# Patient Record
Sex: Male | Born: 1975 | Race: Black or African American | Hispanic: No | Marital: Married | Smoking: Never smoker
Health system: Southern US, Community
[De-identification: ages and names within clinical notes are randomized; demographics above are authoritative.]

## PROBLEM LIST (undated history)

## (undated) DIAGNOSIS — F329 Major depressive disorder, single episode, unspecified: Secondary | ICD-10-CM

## (undated) DIAGNOSIS — I509 Heart failure, unspecified: Secondary | ICD-10-CM

## (undated) DIAGNOSIS — T1491XA Suicide attempt, initial encounter: Secondary | ICD-10-CM

## (undated) DIAGNOSIS — F32A Depression, unspecified: Secondary | ICD-10-CM

## (undated) DIAGNOSIS — I10 Essential (primary) hypertension: Secondary | ICD-10-CM

## (undated) HISTORY — PX: NO PAST SURGERIES: SHX2092

## (undated) HISTORY — DX: Essential (primary) hypertension: I10

---

## 2018-04-16 ENCOUNTER — Emergency Department (HOSPITAL_COMMUNITY): Payer: Self-pay

## 2018-04-16 ENCOUNTER — Other Ambulatory Visit: Payer: Self-pay

## 2018-04-16 ENCOUNTER — Encounter (HOSPITAL_COMMUNITY): Payer: Self-pay | Admitting: Emergency Medicine

## 2018-04-16 ENCOUNTER — Inpatient Hospital Stay (HOSPITAL_COMMUNITY)
Admission: EM | Admit: 2018-04-16 | Discharge: 2018-04-21 | DRG: 918 | Disposition: A | Discharge status: Other Acute Inpatient Hospital | Payer: Self-pay | Attending: Internal Medicine | Admitting: Internal Medicine

## 2018-04-16 DIAGNOSIS — T50901A Poisoning by unspecified drugs, medicaments and biological substances, accidental (unintentional), initial encounter: Secondary | ICD-10-CM | POA: Diagnosis present

## 2018-04-16 DIAGNOSIS — R45850 Homicidal ideations: Secondary | ICD-10-CM | POA: Diagnosis present

## 2018-04-16 DIAGNOSIS — Z653 Problems related to other legal circumstances: Secondary | ICD-10-CM

## 2018-04-16 DIAGNOSIS — T1491XA Suicide attempt, initial encounter: Secondary | ICD-10-CM | POA: Diagnosis present

## 2018-04-16 DIAGNOSIS — Z6835 Body mass index (BMI) 35.0-35.9, adult: Secondary | ICD-10-CM

## 2018-04-16 DIAGNOSIS — Z79899 Other long term (current) drug therapy: Secondary | ICD-10-CM

## 2018-04-16 DIAGNOSIS — F419 Anxiety disorder, unspecified: Secondary | ICD-10-CM | POA: Diagnosis present

## 2018-04-16 DIAGNOSIS — E669 Obesity, unspecified: Secondary | ICD-10-CM | POA: Diagnosis present

## 2018-04-16 DIAGNOSIS — T43292A Poisoning by other antidepressants, intentional self-harm, initial encounter: Secondary | ICD-10-CM | POA: Diagnosis present

## 2018-04-16 DIAGNOSIS — R1310 Dysphagia, unspecified: Secondary | ICD-10-CM | POA: Diagnosis present

## 2018-04-16 DIAGNOSIS — R000 Tachycardia, unspecified: Secondary | ICD-10-CM | POA: Diagnosis present

## 2018-04-16 DIAGNOSIS — T43212A Poisoning by selective serotonin and norepinephrine reuptake inhibitors, intentional self-harm, initial encounter: Principal | ICD-10-CM | POA: Diagnosis present

## 2018-04-16 DIAGNOSIS — F322 Major depressive disorder, single episode, severe without psychotic features: Secondary | ICD-10-CM

## 2018-04-16 DIAGNOSIS — Z915 Personal history of self-harm: Secondary | ICD-10-CM

## 2018-04-16 DIAGNOSIS — R251 Tremor, unspecified: Secondary | ICD-10-CM | POA: Diagnosis present

## 2018-04-16 DIAGNOSIS — R61 Generalized hyperhidrosis: Secondary | ICD-10-CM | POA: Diagnosis present

## 2018-04-16 HISTORY — DX: Suicide attempt, initial encounter: T14.91XA

## 2018-04-16 HISTORY — DX: Depression, unspecified: F32.A

## 2018-04-16 HISTORY — DX: Major depressive disorder, single episode, unspecified: F32.9

## 2018-04-16 LAB — CBC WITH DIFFERENTIAL/PLATELET
ABS IMMATURE GRANULOCYTES: 0.02 10*3/uL (ref 0.00–0.07)
BASOS PCT: 1 %
Basophils Absolute: 0 10*3/uL (ref 0.0–0.1)
Eosinophils Absolute: 0.1 10*3/uL (ref 0.0–0.5)
Eosinophils Relative: 1 %
HCT: 40.6 % (ref 39.0–52.0)
Hemoglobin: 13.1 g/dL (ref 13.0–17.0)
Immature Granulocytes: 0 %
Lymphocytes Relative: 26 %
Lymphs Abs: 1.7 10*3/uL (ref 0.7–4.0)
MCH: 27.5 pg (ref 26.0–34.0)
MCHC: 32.3 g/dL (ref 30.0–36.0)
MCV: 85.3 fL (ref 80.0–100.0)
MONO ABS: 0.5 10*3/uL (ref 0.1–1.0)
Monocytes Relative: 8 %
NEUTROS ABS: 4.1 10*3/uL (ref 1.7–7.7)
Neutrophils Relative %: 64 %
PLATELETS: 206 10*3/uL (ref 150–400)
RBC: 4.76 MIL/uL (ref 4.22–5.81)
RDW: 14.6 % (ref 11.5–15.5)
WBC: 6.5 10*3/uL (ref 4.0–10.5)
nRBC: 0 % (ref 0.0–0.2)

## 2018-04-16 LAB — RAPID URINE DRUG SCREEN, HOSP PERFORMED
Amphetamines: NOT DETECTED
BARBITURATES: NOT DETECTED
BENZODIAZEPINES: NOT DETECTED
Cocaine: NOT DETECTED
Opiates: NOT DETECTED
Tetrahydrocannabinol: NOT DETECTED

## 2018-04-16 LAB — I-STAT TROPONIN, ED
Troponin i, poc: 0 ng/mL (ref 0.00–0.08)
Troponin i, poc: 0 ng/mL (ref 0.00–0.08)

## 2018-04-16 LAB — COMPREHENSIVE METABOLIC PANEL
ALK PHOS: 40 U/L (ref 38–126)
ALT: 49 U/L — AB (ref 0–44)
AST: 42 U/L — AB (ref 15–41)
Albumin: 3.8 g/dL (ref 3.5–5.0)
Anion gap: 8 (ref 5–15)
BILIRUBIN TOTAL: 1.1 mg/dL (ref 0.3–1.2)
BUN: 13 mg/dL (ref 6–20)
CALCIUM: 9 mg/dL (ref 8.9–10.3)
CO2: 23 mmol/L (ref 22–32)
CREATININE: 1.24 mg/dL (ref 0.61–1.24)
Chloride: 106 mmol/L (ref 98–111)
GFR calc Af Amer: >60 mL/min (ref >60)
GFR calc non Af Amer: >60 mL/min (ref >60)
Glucose, Bld: 90 mg/dL (ref 70–99)
Potassium: 4.3 mmol/L (ref 3.5–5.1)
Sodium: 137 mmol/L (ref 135–145)
TOTAL PROTEIN: 6.3 g/dL — AB (ref 6.5–8.1)

## 2018-04-16 LAB — SALICYLATE LEVEL

## 2018-04-16 LAB — ACETAMINOPHEN LEVEL: Acetaminophen (Tylenol), Serum: <10 ug/mL — ABNORMAL LOW (ref 10–30)

## 2018-04-16 LAB — ETHANOL: Alcohol, Ethyl (B): <10 mg/dL (ref <10)

## 2018-04-16 MED ORDER — CHARCOAL ACTIVATED PO LIQD
25.0000 g | Freq: Once | ORAL | Status: AC
  Administered 2018-04-16: 25 g via ORAL
  Filled 2018-04-16: qty 240

## 2018-04-16 MED ORDER — ONDANSETRON 4 MG PO TBDP: 4.0000 mg | ORAL_TABLET | ORAL | Status: DC | PRN

## 2018-04-16 MED ORDER — LORAZEPAM 2 MG/ML IJ SOLN
1.0000 mg | INTRAMUSCULAR | Status: DC | PRN
  Filled 2018-04-16: qty 1

## 2018-04-16 MED ORDER — CHARCOAL ACTIVATED PO LIQD
50.0000 g | Freq: Once | ORAL | Status: AC
  Administered 2018-04-16: 50 g via ORAL
  Filled 2018-04-16: qty 240

## 2018-04-16 MED ORDER — ONDANSETRON HCL 4 MG/2ML IJ SOLN
4.0000 mg | Freq: Once | INTRAMUSCULAR | Status: AC
  Administered 2018-04-16: 4 mg via INTRAVENOUS
  Filled 2018-04-16: qty 2

## 2018-04-16 NOTE — ED Notes (Signed)
Per EDPA and poison control pt to be on cardiac monitor for 24 hours from time of ingestion before medically clear.

## 2018-04-16 NOTE — ED Notes (Signed)
Pt meets criteria for inpatient placement per [facility].  Sitter at bedside. Continues to be monitored. Sitter at bedside.

## 2018-04-16 NOTE — ED Notes (Signed)
Sitter at bedside.

## 2018-04-16 NOTE — ED Notes (Signed)
Primary RN has confirmed that PA and Charge RN is aware that patient is not medically cleared til 1400 on 04/17/18 and has requested patient to be transferred to Psy hold area with seizure pads and seizure precautions; Patient will have to be monitored via Dinamap in room;Sitter will remain at bedside; EDP and Charge RN is aware of pt's medical status at this time-Monique,RN

## 2018-04-16 NOTE — ED Notes (Signed)
Suicide sitter requested, was told a sitter would be available in about 20 minutes.
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
## 2018-04-16 NOTE — ED Notes (Signed)
2 cups water given to pt

## 2018-04-16 NOTE — ED Provider Notes (Addendum)
[facility] EMERGENCY DEPARTMENT Provider Note   CSN: [id number] Arrival date & time: 04/16/18  1457     History   Chief Complaint Chief Complaint  Patient presents with  . Suicide Attempt    HPI Trevor Graham is a 42 y.o. male here for evaluation of suicide attempt at approx 1430-1700 today. Pt states he has been feeling suicidal for close to a year since finding out his wife had an affair. He ingested 6 trazadone 100 mg and 28 300 mg buproprion.  His PO went to check on him and pt told him he wanted to die, EMS was called. Pt reports this is not his first suicide attempt. He repeats "I just want to die" multiple times. He reports associated chest pressure, left sided, constant, non radiating, since taking medication, light-headedness, headache and feeling short winded.  He wants to kill the man his wife cheated on him with and has a plan but does not want to talk about it.  HPI  History reviewed. No pertinent past medical history.  There are no active problems to display for this patient.   History reviewed. No pertinent surgical history.    Home Medications    Prior to Admission medications   Medication Sig Start Date End Date Taking? Authorizing Provider  buPROPion (WELLBUTRIN XL) 300 MG 24 hr tablet Take 300 mg by mouth daily.   Yes [provider]  traZODone (DESYREL) 100 MG tablet Take 100 mg by mouth at bedtime.   Yes [provider]    Family History History reviewed. No pertinent family history.  Social History Social History   Tobacco Use  . Smoking status: Not on file  Substance Use Topics  . Alcohol use: Not on file  . Drug use: Not on file     Allergies   Patient has no known allergies.   Review of Systems Review of Systems  Respiratory: Positive for shortness of breath.   Cardiovascular: Positive for chest pain.  Neurological: Positive for light-headedness and headaches.  Psychiatric/Behavioral: Positive  for self-injury and suicidal ideas. The patient is nervous/anxious.   All other systems reviewed and are negative.    Physical Exam Updated Vital Signs BP 120/76 (BP Location: Right Arm)   Pulse 88   Temp 98.1 F (36.7 C)   Resp 18   Ht 6\' 2"  (1.88 m)   Wt 123.8 kg   SpO2 96%   BMI 35.05 kg/m   Physical Exam  Constitutional: He is oriented to person, place, and time. He appears well-developed and well-nourished.  Tearful.  Poor eye contact.  HENT:  Head: Normocephalic and atraumatic.  Nose: Nose normal.  Eyes: Pupils are equal, round, and reactive to light. Conjunctivae and EOM are normal.  Neck: Normal range of motion.  Cardiovascular: Normal rate, regular rhythm and normal heart sounds.  No murmur heard. Pulmonary/Chest: Effort normal and breath sounds normal.  Abdominal: Soft. Bowel sounds are normal. There is no tenderness.  No G/R/R. No suprapubic or CVA tenderness. Negative Murphy's and McBurney's  Musculoskeletal: Normal range of motion.  Neurological: He is alert and oriented to person, place, and time.  Skin: Skin is warm and dry. Capillary refill takes less than 2 seconds.  Psychiatric: His affect is inappropriate. He is withdrawn. Cognition and memory are normal. He expresses impulsivity and inappropriate judgment. He expresses suicidal ideation. He expresses suicidal plans.  Nursing note and vitals reviewed.    ED Treatments / Results  Labs (all labs ordered  are listed, but only abnormal results are displayed) Labs Reviewed  COMPREHENSIVE METABOLIC PANEL - Abnormal; Notable for the following components:      Result Value   Total Protein 6.3 (*)    AST 42 (*)    ALT 49 (*)    All other components within normal limits  ACETAMINOPHEN LEVEL - Abnormal; Notable for the following components:   Acetaminophen (Tylenol), Serum <10 (*)    All other components within normal limits  ETHANOL  RAPID URINE DRUG SCREEN, HOSP PERFORMED  CBC WITH DIFFERENTIAL/PLATELET    SALICYLATE LEVEL  I-STAT TROPONIN, ED  I-STAT TROPONIN, ED    EKG EKG Interpretation  Date/Time:  Wednesday April 16 2018 15:31:19 EST Ventricular Rate:  97 PR Interval:    QRS Duration: 84 QT Interval:  355 QTC Calculation: 451 R Axis:   38 Text Interpretation:  Sinus rhythm No old tracing to compare Confirmed by Raeford Razor [id number]) on 04/16/2018 4:00:23 PM   Radiology Dg Chest Port 1 View  Result Date: 04/16/2018 CLINICAL DATA:  Suicide attempt EXAM: PORTABLE CHEST 1 VIEW COMPARISON:  None. FINDINGS: The heart size and mediastinal contours are within normal limits. Both lungs are clear. The visualized skeletal structures are unremarkable. IMPRESSION: No active disease. Electronically Signed   By: Marlan Palau M.D.   On: 04/16/2018 16:18    Procedures Procedures (including critical care time)  Medications Ordered in ED Medications  charcoal activated (NO SORBITOL) (ACTIDOSE-AQUA) suspension 50 g (50 g Oral Given 04/16/18 1625)  ondansetron (ZOFRAN) injection 4 mg (4 mg Intravenous Given 04/16/18 1624)  charcoal activated (NO SORBITOL) (ACTIDOSE-AQUA) suspension 25 g (25 g Oral Given 04/16/18 1625)     Initial Impression / Assessment and Plan / ED Course  I have reviewed the triage vital signs and the nursing notes.  Pertinent labs & imaging results that were available during my care of the patient were reviewed by me and considered in my medical decision making (see chart for details).     I spoke to poison control pharmacist.  They recommend activated charcoal 1g/kg for max dose 75 mg a day, observation for 24 hours from ingestion until back to baseline, formal psych evaluation, Tylenol, aspirin, EtOH levels, electrolyte levels, cardiac monitoring and serial EKGs every 4-6 hours.  Potential adverse effects include seizures, QTc/QRs as prolongation, agitation, tremors.  Benzodiazepines as needed.  Supportive care. EKG normal. HD stable. Mentating well, asking for food.   Will obtain labs, trop, give charcoal, reassess.   2035: Work up in ER reassuring. VSS. Pt reports mild epigastric abdominal pain but requesting food.  Discussed work up with pt, and plan to monitor for 24 hr.  2245: Repeat EKG stable.  Patient's vital signs stable.  TTS recommends admission.  RN contacted poison control who recommends patient needs to be on cardiac monitor for a total of 24 hours after ingestion and be observed for seizures until completely medically cleared.  Patient will be handed off to oncoming ED PA at shift change.  He will be on cardiac monitor and seizure precautions until 1400 04/17/18, at that time he will be med cleared and dispo per psych.   Final Clinical Impressions(s) / ED Diagnoses   Final diagnoses:  Suicide attempt [facility])    ED Discharge Orders    None        Jerrell Mylar 04/16/18 2252    Raeford Razor, MD 04/17/18 [id number]

## 2018-04-16 NOTE — ED Notes (Signed)
Pt drinking 2nd cup of water at this time. Encouraged for urine sample.

## 2018-04-16 NOTE — ED Triage Notes (Signed)
Per EMS pt is from home and comes to [place] for overdose suicide attempt.  He states he took 3300mg  of Bupropion and 600mg  of trazodone.  EMS called poison control and they recommended he take activated charcoal.  He is AOx3 NAD at this time pt is lethargic. CBG 120

## 2018-04-16 NOTE — ED Notes (Signed)
Per poison control pt to remain on cardiac monitoring. Pt supportive care for 24 hours from time of ingestion. If agitation benzos are recommended.

## 2018-04-17 ENCOUNTER — Encounter (HOSPITAL_COMMUNITY): Payer: Self-pay | Admitting: General Practice

## 2018-04-17 ENCOUNTER — Observation Stay (HOSPITAL_COMMUNITY): Payer: Medicaid Other

## 2018-04-17 DIAGNOSIS — T43292A Poisoning by other antidepressants, intentional self-harm, initial encounter: Secondary | ICD-10-CM

## 2018-04-17 DIAGNOSIS — T1491XA Suicide attempt, initial encounter: Secondary | ICD-10-CM | POA: Diagnosis present

## 2018-04-17 DIAGNOSIS — T43212A Poisoning by selective serotonin and norepinephrine reuptake inhibitors, intentional self-harm, initial encounter: Principal | ICD-10-CM

## 2018-04-17 DIAGNOSIS — T50901A Poisoning by unspecified drugs, medicaments and biological substances, accidental (unintentional), initial encounter: Secondary | ICD-10-CM | POA: Diagnosis present

## 2018-04-17 DIAGNOSIS — F322 Major depressive disorder, single episode, severe without psychotic features: Secondary | ICD-10-CM

## 2018-04-17 DIAGNOSIS — T50902A Poisoning by unspecified drugs, medicaments and biological substances, intentional self-harm, initial encounter: Secondary | ICD-10-CM

## 2018-04-17 LAB — CBC
HCT: 37.2 % — ABNORMAL LOW (ref 39.0–52.0)
Hemoglobin: 12.3 g/dL — ABNORMAL LOW (ref 13.0–17.0)
MCH: 27.8 pg (ref 26.0–34.0)
MCHC: 33.1 g/dL (ref 30.0–36.0)
MCV: 84 fL (ref 80.0–100.0)
PLATELETS: 186 10*3/uL (ref 150–400)
RBC: 4.43 MIL/uL (ref 4.22–5.81)
RDW: 14.4 % (ref 11.5–15.5)
WBC: 6 10*3/uL (ref 4.0–10.5)
nRBC: 0 % (ref 0.0–0.2)

## 2018-04-17 LAB — BASIC METABOLIC PANEL
ANION GAP: 8 (ref 5–15)
BUN: 10 mg/dL (ref 6–20)
CALCIUM: 8.5 mg/dL — AB (ref 8.9–10.3)
CO2: 23 mmol/L (ref 22–32)
Chloride: 107 mmol/L (ref 98–111)
Creatinine, Ser: 1.39 mg/dL — ABNORMAL HIGH (ref 0.61–1.24)
GFR calc Af Amer: >60 mL/min (ref >60)
GLUCOSE: 113 mg/dL — AB (ref 70–99)
Potassium: 3.7 mmol/L (ref 3.5–5.1)
Sodium: 138 mmol/L (ref 135–145)

## 2018-04-17 LAB — HEMOGLOBIN A1C
HEMOGLOBIN A1C: 6 % — AB (ref 4.8–5.6)
Mean Plasma Glucose: 125.5 mg/dL

## 2018-04-17 MED ORDER — ENOXAPARIN SODIUM 40 MG/0.4ML ~~LOC~~ SOLN
40.0000 mg | Freq: Every day | SUBCUTANEOUS | Status: DC
  Administered 2018-04-17 – 2018-04-21 (×5): 40 mg via SUBCUTANEOUS
  Filled 2018-04-17 (×6): qty 0.4

## 2018-04-17 MED ORDER — SODIUM CHLORIDE 0.9 % IV SOLN
INTRAVENOUS | Status: AC
  Administered 2018-04-17 (×3): via INTRAVENOUS

## 2018-04-17 MED ORDER — SODIUM CHLORIDE 0.9% FLUSH
3.0000 mL | Freq: Two times a day (BID) | INTRAVENOUS | Status: DC
  Administered 2018-04-18 – 2018-04-20 (×4): 3 mL via INTRAVENOUS

## 2018-04-17 MED ORDER — ACETAMINOPHEN 325 MG PO TABS: 650.0000 mg | ORAL_TABLET | ORAL | Status: DC | PRN

## 2018-04-17 MED ORDER — LORAZEPAM 2 MG/ML IJ SOLN
1.0000 mg | INTRAMUSCULAR | Status: DC | PRN
  Filled 2018-04-17: qty 1

## 2018-04-17 MED ORDER — SODIUM CHLORIDE 0.9 % IV BOLUS
1000.0000 mL | Freq: Once | INTRAVENOUS | Status: AC
  Administered 2018-04-17: 1000 mL via INTRAVENOUS

## 2018-04-17 MED ORDER — FOLIC ACID 5 MG/ML IJ SOLN
1.0000 mg | Freq: Every day | INTRAMUSCULAR | Status: DC
  Administered 2018-04-18: 1 mg via INTRAVENOUS
  Filled 2018-04-17 (×2): qty 0.2

## 2018-04-17 MED ORDER — SODIUM CHLORIDE 0.9 % IV SOLN: INTRAVENOUS | Status: AC

## 2018-04-17 MED ORDER — LORAZEPAM 2 MG/ML IJ SOLN
1.0000 mg | INTRAMUSCULAR | Status: DC | PRN
  Administered 2018-04-17 (×3): 1 mg via INTRAVENOUS
  Filled 2018-04-17: qty 1

## 2018-04-17 MED ORDER — LORAZEPAM 2 MG/ML IJ SOLN
2.0000 mg | INTRAMUSCULAR | Status: DC | PRN
  Administered 2018-04-17: 2 mg via INTRAVENOUS
  Filled 2018-04-17: qty 1

## 2018-04-17 MED ORDER — ALUM & MAG HYDROXIDE-SIMETH 200-200-20 MG/5ML PO SUSP: 30.0000 mL | ORAL | Status: DC | PRN

## 2018-04-17 NOTE — Procedures (Signed)
ELECTROENCEPHALOGRAM REPORT   Patient: Trevor Graham       Room #: 6N01C EEG No. ID: [id number] Age: 42 y.o.        Sex: male Referring Physician: Rhona Leavens Report Date:  04/17/2018        Interpreting Physician: Thana Farr  History: Lashaun Krapf is an 42 y.o. male with overdose attempt and altered mental status  Medications:  Folic acid  Conditions of Recording:  This is a 21 channel routine scalp EEG performed with bipolar and monopolar montages arranged in accordance to the international 10/20 system of electrode placement. One channel was dedicated to EKG recording.  The patient is in the awake and drowsy states.  Description:  Artifact is prominent during the recording often obscuring the background rhythm. When able to be visualized the waking background activity consists of a low voltage, symmetrical, fairly well organized, 9 Hz alpha activity, seen from the parieto-occipital and posterior temporal regions.  It is poorly sustained.  Low voltage fast activity, poorly organized, is seen anteriorly and is at times superimposed on more posterior regions.  A mixture of theta and alpha rhythms are seen from the central and temporal regions. The patient drowses with slowing to irregular, low voltage theta and beta activity.   Stage II sleep is not obtained. No epileptiform activity is noted.   Hyperventilation and iIntermittent photic stimulation were not performed.  IMPRESSION: Normal electroencephalogram, awake and drowsy. There are no focal lateralizing or epileptiform features.   Thana Farr, MD Neurology [phone] 04/17/2018, 1:22 PM

## 2018-04-17 NOTE — ED Notes (Signed)
Patient began shaking uncontroably but was still A&Ox 4; EDP at bedside to evaluate patient and determined patient needs to be medically admitted; 2mg  of Ativan given due to patient begin to get lethargic and states "I feel like I'm going to have a seizure; Charge RN notified to have patient move from psy hold area; Patient moved to [facility]

## 2018-04-17 NOTE — ED Notes (Signed)
RN spoke with Verlon Au from Poison control to understand what sx should RN be looking for due to transfer of care; Verlon Au  who states that patient should be on constant cardiac monitoring and that patient could seize for up to 24 hours after ingestion; Verlon Au recommended patient be medically admit and should not be cleared for at least 24 hours at this time; Verlon Au state that medication ingested can cause patient to seize or cardiac arrest; EDP notified of recommendation-Monique,RN

## 2018-04-17 NOTE — ED Notes (Signed)
Pt complaining of left shoulder pain. Wanting to see the doc.

## 2018-04-17 NOTE — ED Notes (Signed)
Report called to 6N

## 2018-04-17 NOTE — ED Notes (Signed)
Bag sandwich meal given to pt to eat

## 2018-04-17 NOTE — H&P (Signed)
History and Physical   Trevor Graham ZOX:[id number] DOB: 03/19/1976 DOA: 04/16/2018  PCP: Patient, No Pcp Per  Chief Complaint: suicide attempt  Note history is obtained solely from chart review as the patient is unable to provide history due to his current state/confused and less responsive.  History is obtained also from the emergency medicine team.  HPI:  This is a 42 year old man with medical problems including obesity and depression/anxiety who was reported to have had a suicide attempt via overdose of twenty-eight 300 mg tablets of bupropion and 600 mg of trazodone on April 16, 2018 during the timeframe of 2:30 PM and 5 PM.  Per chart review, patient depressed after marital discord over the past year.  Poison control was contacted by the emergency medicine team and they recommended 24-hour observation with telemetry and frequent EKG monitoring.  Activated charcoal was recommended which was given.  The patient is able to tell me his name and current year, but will not provide further details, or he is unable to do it is difficult to ascertain.  He is able to tell me that he has some epigastric discomfort and has some difficulty swallowing a sandwich earlier in the evening -it was just difficult to swallow.  ED Course: Vital signs on emergency medicine presentation were normal, however most recently heart rate 150, RR 34.  Was given activated charcoal, CBC, troponin, ETOH level, salicylate level, and CMP were unremarkable.  UDS negative for illicit substances.  EKG personally reviewed sinus tachycardia with rate of about 130 bpm.  QTc 469 milliseconds.  Prior to my evaluating the patient in the emergency department, he was noted to have tremulousness/uncontrolled shaking by the emergency medicine team was given 2 mg of lorazepam.  Upon my evaluation, his symptoms persisted in the bilateral upper extremities, did not lose consciousness, did not lose bowel or bladder continence, did not bite  tongue.  An additional 1 mg of Ativan was given IV.  His symptoms persisted for a total of 10 to 15 minutes before they abated, followed by diaphoresis.  Of note, I discussed the patient's case at 2:45 AM with poison control, "Verlon Au she relayed recommendations for ongoing benzodiazepine dosing and response to patient's symptoms including shakiness/tremulousness in addition to vital sign instability.  She agreed with a high level of care in the floor due to high risk for decompensation with progression to seizures and respiratory failure.  She related that she believe the risk was highest within the next 24 hours.  Review of Systems: A complete ROS unable to be obtained due to his current medical state.  Other details the history including past medical history, Family hx, allergies, past surgical history, outpatient medications, social history is unable to be obtained/verified due to his lack of ability to provide history.  History reviewed. No pertinent past medical history. Social History   Socioeconomic History  . Marital status: Divorced    Spouse name: Not on file  . Number of children: Not on file  . Years of education: Not on file  . Highest education level: Not on file  Occupational History  . Not on file  Social Needs  . Financial resource strain: Not on file  . Food insecurity:    Worry: Not on file    Inability: Not on file  . Transportation needs:    Medical: Not on file    Non-medical: Not on file  Tobacco Use  . Smoking status: Not on file  Substance and Sexual Activity  .  Alcohol use: Not on file  . Drug use: Not on file  . Sexual activity: Not on file  Lifestyle  . Physical activity:    Days per week: Not on file    Minutes per session: Not on file  . Stress: Not on file  Relationships  . Social connections:    Talks on phone: Not on file    Gets together: Not on file    Attends religious service: Not on file    Active member of club or organization: Not  on file    Attends meetings of clubs or organizations: Not on file    Relationship status: Not on file  . Intimate partner violence:    Fear of current or ex partner: Not on file    Emotionally abused: Not on file    Physically abused: Not on file    Forced sexual activity: Not on file  Other Topics Concern  . Not on file  Social History Narrative  . Not on file   History reviewed. No pertinent family history.  Physical Exam: Vitals:   04/16/18 2141 04/17/18 0036 04/17/18 0141 04/17/18 0200  BP: 120/76 101/69 (!) 135/94 108/76  Pulse: 88 86 (!) 128 (!) 150  Resp: 18 18 (!) 37 (!) 34  Temp:      SpO2: 96% 98% 97% 98%  Weight:      Height:       General: Obese black man, minimally verbally interactive following aforementioned seemingly uncontrolled bilateral upper extremity shaking/tremors.  Diaphoretic. ENT: Grossly normal hearing, sclera anicteric. Cardiovascular: Tachycardic, no murmurs appreciated, no lower extremity edema. Respiratory: CTA bilaterally. No wheezes or crackles. Normal respiratory effort.  No stridor.   Abdomen: Soft, non-tender.  Skin: No rash or induration seen on limited exam. Musculoskeletal: No gross deformities. Psychiatric: Flat affect, depressed mood. Neurologic: Is arousable to voice following aforementioned episode, has intermittent tremulousness of bilateral upper extremities, is oriented to year and person.  I have personally reviewed the following labs, culture data, and imaging studies.  Assessment/Plan:  #Bupropion and trazodone overdose / suicide attempt #Tremors / clonus Course: Ingested twenty-eight 300 mg tablets of bupropion and 600 mg of trazodone on April 16, 2018 during the timeframe of 2:30 PM and 5 PM. Was given activated charcoal by the emergency medicine team.  Had episode in the emergency of bilateral clonus of the upper extremities without overt signs of generalized tonic clinic seizure.  Suspect related to substances ingested.   Was given total of 3 mg IV lorazepam. A/P: Currently, with some vital sign abnormalities (tachycardia, increased RR), but is alert and oriented (albeit reluctantly or minimally).  After discussion with poison control center, will continue to heed their recommendations on treating with as needed IV lorazepam therapy.  Will institute CIWA protocol to help guide benzodiazepine administration.  If symptoms are not able to be controlled with intermittent IV benzodiazepine therapy, will require higher level of care for continuous sedation.  Monitor on telemetry.  Providing 1 L NS bolus followed by a rate to avoid dehydration.  EEG in AM. Seizure precautions. NPO.    #Other problems: -Suicide attempt: will transition to psychiatry team once medically optimized; sitter at bedside, suicide precautions.  DVT prophylaxis: Subq Lovenox Code Status: full code Disposition Plan: to be determined depending on clinical course Consults called: None, consider neurology if convincing evidence of seizures or critical care if clinical deterioration Admission status: Admit to step down unit, hospital medicine service   Laurell Roof, MD [facility] Page:[phone]  If 7PM-7AM, please contact night-coverage [email or web address] Password [email or web address]  This document was created using the aid of voice recognition / dication software.

## 2018-04-17 NOTE — Progress Notes (Signed)
EEG Completed; Results Pending  

## 2018-04-17 NOTE — Progress Notes (Signed)
PROGRESS NOTE    Trevor Graham  ZOX:[id number] DOB: [birth year] DOA: 04/16/2018 PCP: Patient, No Pcp Per    Brief Narrative:  42 year old man with medical problems including obesity and depression/anxiety who was reported to have had a suicide attempt via overdose of twenty-eight 300 mg tablets of bupropion and 600 mg of trazodone on April 16, 2018 during the timeframe of 2:30 PM and 5 PM.  Per chart review, patient depressed after marital discord over the past year.  Poison control was contacted by the emergency medicine team and they recommended 24-hour observation with telemetry and frequent EKG monitoring.  Activated charcoal was recommended which was given.  Patient was admitted for concerns of suicidal attempt. Also reported to have homicidal ideations  Assessment & Plan:   Active Problems:   Medication overdose   Suicide attempt (HCC)   1. Intentional medication overdose 1. Intentional bupropion and trazodone overdose  2. Case was already discussed with Poison Control who recommended close monitoring for 24hrs 3. Remains stable thus far 4. Have consulted Psych who recommends Inpatient Psych 2. Depression/SI/HI 1. Plan per Psychiatry 2. Involuntarily commit per Psych. 3. Patient cannot leave AMA 3. Obesity 1. Stable at present 2. Recommend diet/lifestyle modification  DVT prophylaxis: Lovenox subQ Code Status: Full Family Communication: Pt in room, family not at bedside Disposition Plan: Inpatient psych when medically stable  Consultants:   Psychiatry  Procedures:     Antimicrobials: Anti-infectives (From admission, onward)   None       Subjective: Wanting to eat this AM  Objective: Vitals:   04/17/18 0800 04/17/18 0900 04/17/18 1000 04/17/18 1113  BP: 118/84 128/79  116/84  Pulse: (!) 105 (!) 105 100 92  Resp: (!) 25 (!) 21 (!) 25 (!) 22  Temp:    98.2 F (36.8 C)  TempSrc:    Oral  SpO2: 97% 97% 97% 97%  Weight:      Height:         Intake/Output Summary (Last 24 hours) at 04/17/2018 1458 Last data filed at 04/17/2018 1257 Gross per 24 hour  Intake 600 ml  Output 1800 ml  Net -1200 ml   Filed Weights   04/16/18 1506  Weight: 123.8 kg    Examination:  General exam: Appears calm and comfortable  Respiratory system: Clear to auscultation. Respiratory effort normal. Cardiovascular system: S1 & S2 heard, RRR Gastrointestinal system: Abdomen is nondistended, soft and nontender. No organomegaly or masses felt. Normal bowel sounds heard. Central nervous system: Alert and oriented. No focal neurological deficits. Extremities: Symmetric 5 x 5 power. Skin: No rashes, lesions or ulcers Psychiatry: flat affect, appears depressed   Data Reviewed: I have personally reviewed following labs and imaging studies  CBC: Recent Labs  Lab 04/16/18 1628 04/17/18 0321  WBC 6.5 6.0  NEUTROABS 4.1  --   HGB 13.1 12.3*  HCT 40.6 37.2*  MCV 85.3 84.0  PLT 206 186   Basic Metabolic Panel: Recent Labs  Lab 04/16/18 1628 04/17/18 0321  NA 137 138  K 4.3 3.7  CL 106 107  CO2 23 23  GLUCOSE 90 113*  BUN 13 10  CREATININE 1.24 1.39*  CALCIUM 9.0 8.5*   GFR: Estimated Creatinine Clearance: 98.7 mL/min (A) (by C-G formula based on SCr of 1.39 mg/dL (H)). Liver Function Tests: Recent Labs  Lab 04/16/18 1628  AST 42*  ALT 49*  ALKPHOS 40  BILITOT 1.1  PROT 6.3*  ALBUMIN 3.8   No results for input(s): LIPASE, AMYLASE in  the last 168 hours. No results for input(s): AMMONIA in the last 168 hours. Coagulation Profile: No results for input(s): INR, PROTIME in the last 168 hours. Cardiac Enzymes: No results for input(s): CKTOTAL, CKMB, CKMBINDEX, TROPONINI in the last 168 hours. BNP (last 3 results) No results for input(s): PROBNP in the last 8760 hours. HbA1C: Recent Labs    04/17/18 0321  HGBA1C 6.0*   CBG: No results for input(s): GLUCAP in the last 168 hours. Lipid Profile: No results for input(s):  CHOL, HDL, LDLCALC, TRIG, CHOLHDL, LDLDIRECT in the last 72 hours. Thyroid Function Tests: No results for input(s): TSH, T4TOTAL, FREET4, T3FREE, THYROIDAB in the last 72 hours. Anemia Panel: No results for input(s): VITAMINB12, FOLATE, FERRITIN, TIBC, IRON, RETICCTPCT in the last 72 hours. Sepsis Labs: No results for input(s): PROCALCITON, LATICACIDVEN in the last 168 hours.  No results found for this or any previous visit (from the past 240 hour(s)).   Radiology Studies: Dg Chest Port 1 View  Result Date: 04/16/2018 CLINICAL DATA:  Suicide attempt EXAM: PORTABLE CHEST 1 VIEW COMPARISON:  None. FINDINGS: The heart size and mediastinal contours are within normal limits. Both lungs are clear. The visualized skeletal structures are unremarkable. IMPRESSION: No active disease. Electronically Signed   By: Marlan Palau M.D.   On: 04/16/2018 16:18    Scheduled Meds: . enoxaparin (LOVENOX) injection  40 mg Subcutaneous Daily  . folic acid  1 mg Intravenous Daily  . sodium chloride flush  3 mL Intravenous Q12H   Continuous Infusions:   LOS: 0 days   Rickey Barbara, MD [facility] Pager On Amion  If 7PM-7AM, please contact night-coverage 04/17/2018, 2:58 PM

## 2018-04-17 NOTE — ED Notes (Addendum)
Pt states he is hearing voices that are not there

## 2018-04-17 NOTE — Progress Notes (Signed)
CSW contacted [facility] and spoke to [place], Charity fundraiser.  Related that patient ED TTS consult would be considered "Completed", as it appears patient will not be medically cleared until tomorrow at the earliest.  CSW asked that new TTS consult be submitted once patient is medically cleared.  Timmothy Euler. Kaylyn Lim, MSW, LCSWA Disposition Clinical Social Work [phone] (cell) [phone] (office)

## 2018-04-17 NOTE — ED Notes (Signed)
Pt asked for water. Pt given 2 cups. Pt states he is feeling light headed. Pt sitting up on the side of the bed. rn notified

## 2018-04-17 NOTE — ED Notes (Signed)
Patient states he is lightheaded and has light visible shakes; vitals WNL; RN administered PRN Christa See
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
## 2018-04-17 NOTE — ED Provider Notes (Signed)
Patient had been placed in psychiatric holding for cardiac observation following ingestion of bupropion and trazodone.  He is now having uncontrollable shaking of his left leg.  This is concerning for possible focal seizure.  He is given a dose of lorazepam.  At this point, I feel ongoing observation needs to be done in inpatient setting.  Case is discussed with Dr. Rema Jasmine of [facility], who agrees to admit the patient.   Dione Booze, MD 04/17/18 [phone]

## 2018-04-17 NOTE — ED Notes (Signed)
Checked on pt.  He has sitter with him at bedside and he is in a regular hospital bed for comfort.  Pt is on cardiac monitoring.  Pt appears fidgety and a bit agitated.  Pt expresses that he feels that he can not walk or stand, able to stand while voiding while I was in the room.  Pt expresses that he is hungry, will check with MD regarding getting him something po.  Pt is cooperative, will continue to monitor.

## 2018-04-17 NOTE — ED Notes (Signed)
Updated poison control

## 2018-04-17 NOTE — ED Notes (Signed)
ED Provider at bedside. 

## 2018-04-17 NOTE — ED Notes (Addendum)
Spoke with Pieter Partridge in poison control. Phone number is: [phone] may be reached with any question or concerns. They recommend another EKG at noon, I placed this order.

## 2018-04-18 DIAGNOSIS — T50901D Poisoning by unspecified drugs, medicaments and biological substances, accidental (unintentional), subsequent encounter: Secondary | ICD-10-CM

## 2018-04-18 LAB — COMPREHENSIVE METABOLIC PANEL
ALK PHOS: 36 U/L — AB (ref 38–126)
ALT: 36 U/L (ref 0–44)
AST: 27 U/L (ref 15–41)
Albumin: 3.4 g/dL — ABNORMAL LOW (ref 3.5–5.0)
Anion gap: 6 (ref 5–15)
BUN: 9 mg/dL (ref 6–20)
CALCIUM: 8.7 mg/dL — AB (ref 8.9–10.3)
CHLORIDE: 108 mmol/L (ref 98–111)
CO2: 24 mmol/L (ref 22–32)
CREATININE: 1.38 mg/dL — AB (ref 0.61–1.24)
Glucose, Bld: 78 mg/dL (ref 70–99)
Potassium: 3.8 mmol/L (ref 3.5–5.1)
SODIUM: 138 mmol/L (ref 135–145)
Total Bilirubin: 0.7 mg/dL (ref 0.3–1.2)
Total Protein: 6.1 g/dL — ABNORMAL LOW (ref 6.5–8.1)

## 2018-04-18 LAB — LIPID PANEL
CHOL/HDL RATIO: 4.3 ratio
Cholesterol: 154 mg/dL (ref 0–200)
HDL: 36 mg/dL — ABNORMAL LOW (ref >40)
LDL Cholesterol: 97 mg/dL (ref 0–99)
Triglycerides: 107 mg/dL (ref <150)
VLDL: 21 mg/dL (ref 0–40)

## 2018-04-18 LAB — TROPONIN I: Troponin I: <0.03 ng/mL (ref <0.03)

## 2018-04-18 MED ORDER — HYDROXYZINE HCL 10 MG PO TABS
10.0000 mg | ORAL_TABLET | ORAL | Status: DC | PRN
  Administered 2018-04-18: 10 mg via ORAL
  Filled 2018-04-18 (×2): qty 1

## 2018-04-18 MED ORDER — NITROGLYCERIN 0.4 MG SL SUBL: 0.4000 mg | SUBLINGUAL_TABLET | SUBLINGUAL | Status: DC | PRN

## 2018-04-18 MED ORDER — FOLIC ACID 1 MG PO TABS
1.0000 mg | ORAL_TABLET | Freq: Every day | ORAL | Status: DC
  Administered 2018-04-19 – 2018-04-21 (×3): 1 mg via ORAL
  Filled 2018-04-18 (×3): qty 1

## 2018-04-18 NOTE — Progress Notes (Signed)
   04/18/18 1005  Vitals  Temp (!) 96.8 F (36 C)  BP 105/79  BP Location Right Arm  BP Method Automatic  Patient Position (if appropriate) Lying  Pulse Rate 84  Pulse Rate Source Monitor  Resp (!) 22  Oxygen Therapy  SpO2 95 %  O2 Device Room Air  pt c/o 8/10 left chest pain, says he feels the pain in his rib cage when he breathes. Vs as above. MD to be paged

## 2018-04-18 NOTE — Progress Notes (Signed)
PROGRESS NOTE    Trevor Graham  ZOX:[id number] DOB: [birth year] DOA: 04/16/2018 PCP: Patient, No Pcp Per    Brief Narrative:  42 year old man with medical problems including obesity and depression/anxiety who was reported to have had a suicide attempt via overdose of twenty-eight 300 mg tablets of bupropion and 600 mg of trazodone on April 16, 2018 during the timeframe of 2:30 PM and 5 PM.  Per chart review, patient depressed after marital discord over the past year.  Poison control was contacted by the emergency medicine team and they recommended 24-hour observation with telemetry and frequent EKG monitoring.  Activated charcoal was recommended which was given.  Patient was admitted for concerns of suicidal attempt. Also reported to have homicidal ideations  Assessment & Plan:   Principal Problem:   MDD (major depressive disorder), single episode, severe ([facility]) Active Problems:   Medication overdose   Suicide attempt (HCC)   1. Intentional medication overdose 1. Intentional bupropion and trazodone overdose  2. Case was already discussed with Poison Control who recommended close monitoring for 24hrs 3. EKG reviewed with QTc of 445 4. Remains stable thus far 5. Have consulted Psych who recommends Inpatient Psych 6. Medically clear for inpatient psych at this time 2. Depression/SI/HI 1. Plan per Psychiatry 2. Involuntarily commit per Psych if patient decides to leave 3. Patient cannot leave AMA 3. Obesity 1. Stable at present 2. Recommend diet/lifestyle modification 4. Chest pain 1. Troponin neg 2. Ordered and reviewed EKG, unremarkable with NSR, nonischemic 3. HEART score of 2, suggesting outpatient follow up 4. Chest pain seems to correlate to increased anxiety/agitation while here  DVT prophylaxis: Lovenox subQ Code Status: Full Family Communication: Pt in room, family not at bedside Disposition Plan: Inpatient psych when bed available, medically clear for inpatient  psych at this time  Consultants:   Psychiatry  Procedures:     Antimicrobials: Anti-infectives (From admission, onward)   None      Subjective: Complained of chest pain that self-resolved  Objective: Vitals:   04/17/18 2056 04/18/18 0520 04/18/18 1005 04/18/18 1512  BP: 122/84 109/82 105/79 111/65  Pulse: 86 79 84 88  Resp: 18 20 (!) 22 18  Temp: 99.1 F (37.3 C) 98.4 F (36.9 C) (!) 96.8 F (36 C) 97.9 F (36.6 C)  TempSrc: Oral Oral  Oral  SpO2: 96% 98% 95% 98%  Weight:      Height:        Intake/Output Summary (Last 24 hours) at 04/18/2018 1550 Last data filed at 04/18/2018 1340 Gross per 24 hour  Intake 2470 ml  Output 6360 ml  Net -3890 ml   Filed Weights   04/16/18 1506  Weight: 123.8 kg    Examination: General exam: Awake, laying in bed, in nad Respiratory system: Normal respiratory effort, no wheezing Cardiovascular system: regular rate, s1, s2 Gastrointestinal system: Soft, nondistended, positive BS Central nervous system: CN2-12 grossly intact, strength intact Extremities: Perfused, no clubbing Skin: Normal skin turgor, no notable skin lesions seen Psychiatry: Depressed, insight intact  Data Reviewed: I have personally reviewed following labs and imaging studies  CBC: Recent Labs  Lab 04/16/18 1628 04/17/18 0321  WBC 6.5 6.0  NEUTROABS 4.1  --   HGB 13.1 12.3*  HCT 40.6 37.2*  MCV 85.3 84.0  PLT 206 186   Basic Metabolic Panel: Recent Labs  Lab 04/16/18 1628 04/17/18 0321 04/18/18 0131  NA 137 138 138  K 4.3 3.7 3.8  CL 106 107 108  CO2 23 23 24  GLUCOSE 90 113* 78  BUN 13 10 9   CREATININE 1.24 1.39* 1.38*  CALCIUM 9.0 8.5* 8.7*   GFR: Estimated Creatinine Clearance: 99.4 mL/min (A) (by C-G formula based on SCr of 1.38 mg/dL (H)). Liver Function Tests: Recent Labs  Lab 04/16/18 1628 04/18/18 0131  AST 42* 27  ALT 49* 36  ALKPHOS 40 36*  BILITOT 1.1 0.7  PROT 6.3* 6.1*  ALBUMIN 3.8 3.4*   No results for  input(s): LIPASE, AMYLASE in the last 168 hours. No results for input(s): AMMONIA in the last 168 hours. Coagulation Profile: No results for input(s): INR, PROTIME in the last 168 hours. Cardiac Enzymes: Recent Labs  Lab 04/18/18 1026  TROPONINI <0.03   BNP (last 3 results) No results for input(s): PROBNP in the last 8760 hours. HbA1C: Recent Labs    04/17/18 0321  HGBA1C 6.0*   CBG: No results for input(s): GLUCAP in the last 168 hours. Lipid Profile: Recent Labs    04/18/18 1026  CHOL 154  HDL 36*  LDLCALC 97  TRIG 295  CHOLHDL 4.3   Thyroid Function Tests: No results for input(s): TSH, T4TOTAL, FREET4, T3FREE, THYROIDAB in the last 72 hours. Anemia Panel: No results for input(s): VITAMINB12, FOLATE, FERRITIN, TIBC, IRON, RETICCTPCT in the last 72 hours. Sepsis Labs: No results for input(s): PROCALCITON, LATICACIDVEN in the last 168 hours.  No results found for this or any previous visit (from the past 240 hour(s)).   Radiology Studies: Dg Chest Port 1 View  Result Date: 04/16/2018 CLINICAL DATA:  Suicide attempt EXAM: PORTABLE CHEST 1 VIEW COMPARISON:  None. FINDINGS: The heart size and mediastinal contours are within normal limits. Both lungs are clear. The visualized skeletal structures are unremarkable. IMPRESSION: No active disease. Electronically Signed   By: Marlan Palau M.D.   On: 04/16/2018 16:18    Scheduled Meds: . enoxaparin (LOVENOX) injection  40 mg Subcutaneous Daily  . folic acid  1 mg Oral Daily  . sodium chloride flush  3 mL Intravenous Q12H   Continuous Infusions:   LOS: 0 days   Rickey Barbara, MD [facility] Pager On Amion  If 7PM-7AM, please contact night-coverage 04/18/2018, 3:50 PM

## 2018-04-19 LAB — BASIC METABOLIC PANEL
ANION GAP: 8 (ref 5–15)
BUN: 9 mg/dL (ref 6–20)
CALCIUM: 8.6 mg/dL — AB (ref 8.9–10.3)
CO2: 22 mmol/L (ref 22–32)
Chloride: 108 mmol/L (ref 98–111)
Creatinine, Ser: 1.33 mg/dL — ABNORMAL HIGH (ref 0.61–1.24)
GFR calc Af Amer: >60 mL/min (ref >60)
GLUCOSE: 89 mg/dL (ref 70–99)
POTASSIUM: 4 mmol/L (ref 3.5–5.1)
SODIUM: 138 mmol/L (ref 135–145)

## 2018-04-19 NOTE — Progress Notes (Addendum)
11am-No beds at [facility].  9am-[facility] does not have beds available. CSW sent referral to [facility] for review.   Osborne Casco Vivan Vanderveer LCSW [phone]

## 2018-04-19 NOTE — BH Assessment (Signed)
Reviewed patient with psychiatrist, Dr. Daleen Bo, patient declined at this time due to the current acuity of the unit [facility]) and patient recent history of trying to hang his self while inpatient with [facility].

## 2018-04-19 NOTE — Progress Notes (Signed)
PROGRESS NOTE    Trevor Graham  ZOX:[id number] DOB: 01-22-1976 DOA: 04/16/2018 PCP: Patient, No Pcp Per    Brief Narrative:  42 year old man with medical problems including obesity and depression/anxiety who was reported to have had a suicide attempt via overdose of twenty-eight 300 mg tablets of bupropion and 600 mg of trazodone on April 16, 2018 during the timeframe of 2:30 PM and 5 PM.  Per chart review, patient depressed after marital discord over the past year.  Poison control was contacted by the emergency medicine team and they recommended 24-hour observation with telemetry and frequent EKG monitoring.  Activated charcoal was recommended which was given.  Patient was admitted for concerns of suicidal attempt. Also reported to have homicidal ideations  Assessment & Plan:   Principal Problem:   MDD (major depressive disorder), single episode, severe (HCC) Active Problems:   Medication overdose   Suicide attempt (HCC)   1. Intentional medication overdose 1. Intentional bupropion and trazodone overdose  2. Case was already discussed with Poison Control who recommended close monitoring for 24hrs 3. EKG reviewed with QTc of 445 4. Remains stable thus far 5. Have consulted Psych who recommends Inpatient Psych 6. Medically clear for inpatient psych at this time 7. Discussed with SW, pending bed availability 2. Depression/SI/HI 1. Plan per Psychiatry 2. Involuntarily commit per Psych if patient decides to leave 3. Patient cannot leave AMA 3. Obesity 1. Stable at present 2. Recommend diet/lifestyle modification 4. Chest pain 1. Troponin neg 2. Ordered and reviewed EKG, unremarkable with NSR, nonischemic 3. HEART score of 2, suggesting outpatient follow up 4. Chest pain seems to correlate to increased anxiety/agitation while here  DVT prophylaxis: Lovenox subQ Code Status: Full Family Communication: Pt in room, family not at bedside Disposition Plan: Inpatient psych when  bed available, medically clear for inpatient psych at this time  Consultants:   Psychiatry  Procedures:     Antimicrobials: Anti-infectives (From admission, onward)   None      Subjective: No chest pain at present  Objective: Vitals:   04/18/18 1512 04/18/18 2155 04/19/18 0604 04/19/18 1430  BP: 111/65 124/78 115/73 132/87  Pulse: 88 85 74 86  Resp: 18 18 16 16   Temp: 97.9 F (36.6 C) 98.4 F (36.9 C) 98.3 F (36.8 C) 98.2 F (36.8 C)  TempSrc: Oral Oral Oral Oral  SpO2: 98% 96% 97% 98%  Weight:      Height:        Intake/Output Summary (Last 24 hours) at 04/19/2018 1517 Last data filed at 04/19/2018 1330 Gross per 24 hour  Intake 840 ml  Output 400 ml  Net 440 ml   Filed Weights   04/16/18 1506  Weight: 123.8 kg    Examination: General exam: Conversant, in no acute distress Respiratory system: normal chest rise, clear, no audible wheezing  Data Reviewed: I have personally reviewed following labs and imaging studies  CBC: Recent Labs  Lab 04/16/18 1628 04/17/18 0321  WBC 6.5 6.0  NEUTROABS 4.1  --   HGB 13.1 12.3*  HCT 40.6 37.2*  MCV 85.3 84.0  PLT 206 186   Basic Metabolic Panel: Recent Labs  Lab 04/16/18 1628 04/17/18 0321 04/18/18 0131 04/19/18 0342  NA 137 138 138 138  K 4.3 3.7 3.8 4.0  CL 106 107 108 108  CO2 23 23 24 22   GLUCOSE 90 113* 78 89  BUN 13 10 9 9   CREATININE 1.24 1.39* 1.38* 1.33*  CALCIUM 9.0 8.5* 8.7* 8.6*  GFR: Estimated Creatinine Clearance: 103.2 mL/min (A) (by C-G formula based on SCr of 1.33 mg/dL (H)). Liver Function Tests: Recent Labs  Lab 04/16/18 1628 04/18/18 0131  AST 42* 27  ALT 49* 36  ALKPHOS 40 36*  BILITOT 1.1 0.7  PROT 6.3* 6.1*  ALBUMIN 3.8 3.4*   No results for input(s): LIPASE, AMYLASE in the last 168 hours. No results for input(s): AMMONIA in the last 168 hours. Coagulation Profile: No results for input(s): INR, PROTIME in the last 168 hours. Cardiac Enzymes: Recent Labs  Lab  04/18/18 1026  TROPONINI <0.03   BNP (last 3 results) No results for input(s): PROBNP in the last 8760 hours. HbA1C: Recent Labs    04/17/18 0321  HGBA1C 6.0*   CBG: No results for input(s): GLUCAP in the last 168 hours. Lipid Profile: Recent Labs    04/18/18 1026  CHOL 154  HDL 36*  LDLCALC 97  TRIG 161  CHOLHDL 4.3   Thyroid Function Tests: No results for input(s): TSH, T4TOTAL, FREET4, T3FREE, THYROIDAB in the last 72 hours. Anemia Panel: No results for input(s): VITAMINB12, FOLATE, FERRITIN, TIBC, IRON, RETICCTPCT in the last 72 hours. Sepsis Labs: No results for input(s): PROCALCITON, LATICACIDVEN in the last 168 hours.  No results found for this or any previous visit (from the past 240 hour(s)).   Radiology Studies: No results found.  Scheduled Meds: . enoxaparin (LOVENOX) injection  40 mg Subcutaneous Daily  . folic acid  1 mg Oral Daily  . sodium chloride flush  3 mL Intravenous Q12H   Continuous Infusions:   LOS: 0 days   Rickey Barbara, MD [facility] Pager On Amion  If 7PM-7AM, please contact night-coverage 04/19/2018, 3:17 PM

## 2018-04-19 NOTE — Plan of Care (Signed)
  Problem: Education: Goal: Knowledge of General Education information will improve Description Including pain rating scale, medication(s)/side effects and non-pharmacologic comfort measures Outcome: Progressing   Problem: Health Behavior/Discharge Planning: Goal: Ability to manage health-related needs will improve Outcome: Progressing   Problem: Clinical Measurements: Goal: Ability to maintain clinical measurements within normal limits will improve Outcome: Progressing Goal: Will remain free from infection Outcome: Progressing Goal: Diagnostic test results will improve Outcome: Progressing Goal: Respiratory complications will improve Outcome: Progressing Goal: Cardiovascular complication will be avoided Outcome: Progressing   Problem: Activity: Goal: Risk for activity intolerance will decrease Outcome: Progressing   Problem: Nutrition: Goal: Adequate nutrition will be maintained Outcome: Progressing   Problem: Coping: Goal: Level of anxiety will decrease Outcome: Progressing   Problem: Elimination: Goal: Will not experience complications related to bowel motility Outcome: Progressing Goal: Will not experience complications related to urinary retention Outcome: Progressing   Problem: Pain Managment: Goal: General experience of comfort will improve Outcome: Progressing   Problem: Safety: Goal: Ability to remain free from injury will improve Outcome: Progressing   Problem: Skin Integrity: Goal: Risk for impaired skin integrity will decrease Outcome: Progressing   Problem: Spiritual Needs Goal: Ability to function at adequate level Outcome: Progressing  Sonny Masters, RN 04/19/2018

## 2018-04-20 NOTE — Progress Notes (Signed)
PROGRESS NOTE    Trevor Graham  ZOX:[id number] DOB: 1976/05/26 DOA: 04/16/2018 PCP: Patient, No Pcp Per    Brief Narrative:  42 year old man with medical problems including obesity and depression/anxiety who was reported to have had a suicide attempt via overdose of twenty-eight 300 mg tablets of bupropion and 600 mg of trazodone on April 16, 2018 during the timeframe of 2:30 PM and 5 PM.  Per chart review, patient depressed after marital discord over the past year.  Poison control was contacted by the emergency medicine team and they recommended 24-hour observation with telemetry and frequent EKG monitoring.  Activated charcoal was recommended which was given.  Patient was admitted for concerns of suicidal attempt. Also reported to have homicidal ideations  Assessment & Plan:   Principal Problem:   MDD (major depressive disorder), single episode, severe (HCC) Active Problems:   Medication overdose   Suicide attempt (HCC)   1. Intentional medication overdose 1. Intentional bupropion and trazodone overdose  2. Case was already discussed with Poison Control who recommended close monitoring for 24hrs 3. Most recent EKG reviewed with QTc of 445 4. Remains stable thus far 5. Have consulted Psych who recommends Inpatient Psych 6. Medically clear for inpatient psych at this time 7. SW following. No psych beds  2. Depression/SI/HI 1. Plan per Psychiatry 2. Involuntarily commit per Psych if patient decides to leave 3. Patient cannot leave AMA 3. Obesity 1. Stable at present 2. Recommend diet/lifestyle modification 4. Chest pain 1. Troponin neg 2. Ordered and reviewed EKG, unremarkable with NSR, nonischemic 3. HEART score of 2, suggesting outpatient follow up 4. Chest pain seems to correlate to increased anxiety/agitation while here  DVT prophylaxis: Lovenox subQ Code Status: Full Family Communication: Pt in room, family not at bedside Disposition Plan: Inpatient psych when  bed available, medically clear for inpatient psych at this time  Consultants:   Psychiatry  Procedures:     Antimicrobials: Anti-infectives (From admission, onward)   None      Subjective: Denies chest pain at this time  Objective: Vitals:   04/19/18 1430 04/19/18 2053 04/20/18 0528 04/20/18 1417  BP: 132/87 125/72 111/79 109/74  Pulse: 86 68 (!) 54 82  Resp: 16 16 16 20   Temp: 98.2 F (36.8 C) 98.3 F (36.8 C) 98.3 F (36.8 C) 98.6 F (37 C)  TempSrc: Oral Oral Oral Oral  SpO2: 98% 99% 95% 98%  Weight:      Height:        Intake/Output Summary (Last 24 hours) at 04/20/2018 1536 Last data filed at 04/20/2018 1300 Gross per 24 hour  Intake 840 ml  Output -  Net 840 ml   Filed Weights   04/16/18 1506  Weight: 123.8 kg    Examination: General exam: Awake, laying in bed, in nad Respiratory system: Normal respiratory effort, no wheezing Cardiovascular system: regular rate, s1, s2  Data Reviewed: I have personally reviewed following labs and imaging studies  CBC: Recent Labs  Lab 04/16/18 1628 04/17/18 0321  WBC 6.5 6.0  NEUTROABS 4.1  --   HGB 13.1 12.3*  HCT 40.6 37.2*  MCV 85.3 84.0  PLT 206 186   Basic Metabolic Panel: Recent Labs  Lab 04/16/18 1628 04/17/18 0321 04/18/18 0131 04/19/18 0342  NA 137 138 138 138  K 4.3 3.7 3.8 4.0  CL 106 107 108 108  CO2 23 23 24 22   GLUCOSE 90 113* 78 89  BUN 13 10 9 9   CREATININE 1.24 1.39* 1.38* 1.33*  CALCIUM 9.0 8.5* 8.7* 8.6*   GFR: Estimated Creatinine Clearance: 103.2 mL/min (A) (by C-G formula based on SCr of 1.33 mg/dL (H)). Liver Function Tests: Recent Labs  Lab 04/16/18 1628 04/18/18 0131  AST 42* 27  ALT 49* 36  ALKPHOS 40 36*  BILITOT 1.1 0.7  PROT 6.3* 6.1*  ALBUMIN 3.8 3.4*   No results for input(s): LIPASE, AMYLASE in the last 168 hours. No results for input(s): AMMONIA in the last 168 hours. Coagulation Profile: No results for input(s): INR, PROTIME in the last 168  hours. Cardiac Enzymes: Recent Labs  Lab 04/18/18 1026  TROPONINI <0.03   BNP (last 3 results) No results for input(s): PROBNP in the last 8760 hours. HbA1C: No results for input(s): HGBA1C in the last 72 hours. CBG: No results for input(s): GLUCAP in the last 168 hours. Lipid Profile: Recent Labs    04/18/18 1026  CHOL 154  HDL 36*  LDLCALC 97  TRIG 161  CHOLHDL 4.3   Thyroid Function Tests: No results for input(s): TSH, T4TOTAL, FREET4, T3FREE, THYROIDAB in the last 72 hours. Anemia Panel: No results for input(s): VITAMINB12, FOLATE, FERRITIN, TIBC, IRON, RETICCTPCT in the last 72 hours. Sepsis Labs: No results for input(s): PROCALCITON, LATICACIDVEN in the last 168 hours.  No results found for this or any previous visit (from the past 240 hour(s)).   Radiology Studies: No results found.  Scheduled Meds: . enoxaparin (LOVENOX) injection  40 mg Subcutaneous Daily  . folic acid  1 mg Oral Daily  . sodium chloride flush  3 mL Intravenous Q12H   Continuous Infusions:   LOS: 0 days   Rickey Barbara, MD [facility] Pager On Amion  If 7PM-7AM, please contact night-coverage 04/20/2018, 3:36 PM

## 2018-04-20 NOTE — ED Notes (Signed)
RN used same vial to administer Ativan; PYXIS states undocumented waste but Tori,Charge RN witnessed RN adminter 2mg  of Ativan from same vile due emergent situation;AD has been notified of via email-Monique,RN

## 2018-04-21 ENCOUNTER — Inpatient Hospital Stay (HOSPITAL_COMMUNITY)
Admission: AD | Admit: 2018-04-21 | Discharge: 2018-04-22 | DRG: 885 | Disposition: A | Discharge status: Home / Self Care | Payer: Federal, State, Local not specified - Other | Source: Intra-hospital | Attending: Psychiatry | Admitting: Psychiatry

## 2018-04-21 ENCOUNTER — Encounter (HOSPITAL_COMMUNITY): Payer: Self-pay | Admitting: *Deleted

## 2018-04-21 ENCOUNTER — Other Ambulatory Visit: Payer: Self-pay

## 2018-04-21 DIAGNOSIS — F332 Major depressive disorder, recurrent severe without psychotic features: Secondary | ICD-10-CM | POA: Diagnosis present

## 2018-04-21 DIAGNOSIS — T43212A Poisoning by selective serotonin and norepinephrine reuptake inhibitors, intentional self-harm, initial encounter: Secondary | ICD-10-CM | POA: Diagnosis present

## 2018-04-21 DIAGNOSIS — Z915 Personal history of self-harm: Secondary | ICD-10-CM | POA: Diagnosis not present

## 2018-04-21 DIAGNOSIS — E1169 Type 2 diabetes mellitus with other specified complication: Secondary | ICD-10-CM | POA: Diagnosis present

## 2018-04-21 DIAGNOSIS — F419 Anxiety disorder, unspecified: Secondary | ICD-10-CM | POA: Diagnosis present

## 2018-04-21 DIAGNOSIS — F322 Major depressive disorder, single episode, severe without psychotic features: Secondary | ICD-10-CM

## 2018-04-21 DIAGNOSIS — Z79899 Other long term (current) drug therapy: Secondary | ICD-10-CM | POA: Diagnosis not present

## 2018-04-21 DIAGNOSIS — R569 Unspecified convulsions: Secondary | ICD-10-CM | POA: Diagnosis present

## 2018-04-21 DIAGNOSIS — G4700 Insomnia, unspecified: Secondary | ICD-10-CM | POA: Diagnosis present

## 2018-04-21 DIAGNOSIS — E669 Obesity, unspecified: Secondary | ICD-10-CM

## 2018-04-21 DIAGNOSIS — T1491XA Suicide attempt, initial encounter: Secondary | ICD-10-CM

## 2018-04-21 DIAGNOSIS — R45850 Homicidal ideations: Secondary | ICD-10-CM | POA: Diagnosis present

## 2018-04-21 DIAGNOSIS — E119 Type 2 diabetes mellitus without complications: Secondary | ICD-10-CM

## 2018-04-21 DIAGNOSIS — T43292A Poisoning by other antidepressants, intentional self-harm, initial encounter: Secondary | ICD-10-CM | POA: Diagnosis present

## 2018-04-21 DIAGNOSIS — Z6834 Body mass index (BMI) 34.0-34.9, adult: Secondary | ICD-10-CM | POA: Diagnosis not present

## 2018-04-21 DIAGNOSIS — Z888 Allergy status to other drugs, medicaments and biological substances status: Secondary | ICD-10-CM | POA: Diagnosis not present

## 2018-04-21 DIAGNOSIS — F445 Conversion disorder with seizures or convulsions: Secondary | ICD-10-CM

## 2018-04-21 DIAGNOSIS — F339 Major depressive disorder, recurrent, unspecified: Secondary | ICD-10-CM | POA: Diagnosis present

## 2018-04-21 DIAGNOSIS — R079 Chest pain, unspecified: Secondary | ICD-10-CM

## 2018-04-21 HISTORY — DX: Heart failure, unspecified: I50.9

## 2018-04-21 MED ORDER — HYDROXYZINE HCL 10 MG PO TABS: 10.0000 mg | ORAL_TABLET | ORAL | 0 refills | Status: DC | PRN

## 2018-04-21 MED ORDER — FOLIC ACID 1 MG PO TABS
1.0000 mg | ORAL_TABLET | Freq: Every day | ORAL | Status: DC
  Administered 2018-04-21 – 2018-04-22 (×2): 1 mg via ORAL
  Filled 2018-04-21 (×6): qty 1

## 2018-04-21 MED ORDER — MAGNESIUM HYDROXIDE 400 MG/5ML PO SUSP: 30.0000 mL | Freq: Every day | ORAL | Status: DC | PRN

## 2018-04-21 MED ORDER — LORAZEPAM 1 MG PO TABS: 1.0000 mg | ORAL_TABLET | Freq: Four times a day (QID) | ORAL | Status: DC | PRN

## 2018-04-21 MED ORDER — ACETAMINOPHEN 325 MG PO TABS: 650.0000 mg | ORAL_TABLET | Freq: Four times a day (QID) | ORAL | Status: DC | PRN

## 2018-04-21 MED ORDER — HYDROXYZINE HCL 25 MG PO TABS: 25.0000 mg | ORAL_TABLET | ORAL | Status: DC | PRN

## 2018-04-21 MED ORDER — ESCITALOPRAM OXALATE 5 MG PO TABS
5.0000 mg | ORAL_TABLET | Freq: Every day | ORAL | Status: DC
  Administered 2018-04-21 – 2018-04-22 (×2): 5 mg via ORAL
  Filled 2018-04-21 (×6): qty 1

## 2018-04-21 MED ORDER — ALUM & MAG HYDROXIDE-SIMETH 200-200-20 MG/5ML PO SUSP: 30.0000 mL | ORAL | Status: DC | PRN

## 2018-04-21 MED ORDER — FOLIC ACID 1 MG PO TABS: 1.0000 mg | ORAL_TABLET | Freq: Every day | ORAL | Status: DC

## 2018-04-21 MED ORDER — RANOLAZINE ER 500 MG PO TB12
500.0000 mg | ORAL_TABLET | Freq: Two times a day (BID) | ORAL | Status: DC
  Filled 2018-04-21 (×6): qty 1

## 2018-04-21 MED ORDER — TRAZODONE HCL 50 MG PO TABS: 50.0000 mg | ORAL_TABLET | Freq: Every evening | ORAL | Status: DC | PRN

## 2018-04-21 NOTE — Tx Team (Signed)
Initial Treatment Plan 04/21/2018 3:15 PM Trevor Graham [id number]:[id number]    PATIENT STRESSORS: Marital or family conflict Medication change or noncompliance   PATIENT STRENGTHS: Ability for insight Average or above average intelligence Capable of independent living General fund of knowledge Motivation for treatment/growth   PATIENT IDENTIFIED PROBLEMS: Depression Suicidal thoughts "Need to get back on some new medications"                     DISCHARGE CRITERIA:  Ability to meet basic life and health needs Improved stabilization in mood, thinking, and/or behavior Reduction of life-threatening or endangering symptoms to within safe limits Verbal commitment to aftercare and medication compliance  PRELIMINARY DISCHARGE PLAN: Attend aftercare/continuing care group Return to previous living arrangement  PATIENT/FAMILY INVOLVEMENT: This treatment plan has been presented to and reviewed with the patient, Trevor Graham, and/or family member, .  The patient and family have been given the opportunity to ask questions and make suggestions.  Trevor Graham, [place], [place] 04/21/2018, 3:15 PM
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
## 2018-04-21 NOTE — Care Management Note (Signed)
Case Management Note  Patient Details  Name: Trevor Graham MRN: [id number] Date of Birth: 10/11/1975  Subjective/Objective: 42 year old man with medical problems including obesity and depression/anxiety who was reported to have had a suicide attempt via overdose oftwenty-eight300 mg tablets of bupropion and 600 mg of trazodone on April 16, 2018 during the timeframe of 2:30 PM and 5 PM.                     Action/Plan: Psych MD recommending IP psych admission; CSW consulted to facilitate dc to psychiatric facility upon dc.    Expected Discharge Date:  04/21/18               Expected Discharge Plan:  Psychiatric Hospital  In-House Referral:  Clinical Social Work  Discharge planning Services  CM Consult  Post Acute Care Choice:    Choice offered to:     DME Arranged:    DME Agency:     HH Arranged:    HH Agency:     Status of Service:  Completed, signed off  If discussed at [employer], dates discussed:    Additional Comments:  04/21/18 J. Kinnick Maus, RN, BSN Per CSW notes, plan voluntary admission to [facility] today.    Glennon Mac, RN 04/21/2018, 1:37 PM

## 2018-04-21 NOTE — Progress Notes (Signed)
Pt accepted to [facility], room 407-2 Dr. Tamera Punt is the attending provider.   Call report to [phone]   Dominique @ [facility] notified.    Pt is voluntary and can be transported by [facility].  Pt is scheduled to arrive at [facility] at 1pm.   Wells Guiles, LCSW, LCAS Disposition CSW [facility] [phone]

## 2018-04-21 NOTE — Progress Notes (Signed)
Adult Psychoeducational Group Note  Date:  04/21/2018 Time:  9:29 PM  Group Topic/Focus:  Wrap-Up Group:   The focus of this group is to help patients review their daily goal of treatment and discuss progress on daily workbooks.  Participation Level:  Active  Participation Quality:  Appropriate  Affect:  Appropriate  Cognitive:  Appropriate  Insight: Appropriate  Engagement in Group:  Engaged  Modes of Intervention:  Discussion  Additional Comments:  The patient expressed that he rates his day a 7.The patient recently arrived to [facility].  Octavio Manns 04/21/2018, 9:29 PM

## 2018-04-21 NOTE — Discharge Summary (Signed)
Physician Discharge Summary  Trevor Graham ZOX:[id number] DOB: 03/23/1976 DOA: 04/16/2018  PCP: Patient, No Pcp Per  Admit date: 04/16/2018 Discharge date: 04/21/2018  Admitted From: Home Disposition:  Inpatient Psych  Recommendations for Outpatient Follow-up:  1. Follow up with PCP in 1-2 weeks when discharged  Discharge Condition:Stable CODE STATUS:Full Diet recommendation: Regular   Brief/Interim Summary: 42 year old man with medical problems including obesity and depression/anxiety who was reported to have had a suicide attempt via overdose oftwenty-eight300 mg tablets of bupropion and 600 mg of trazodone on April 16, 2018 during the timeframe of 2:30 PM and 5 PM. Per chart review, patient depressed after marital discord over the past year. Poison control was contacted by the emergency medicine team and they recommended 24-hour observation with telemetry and frequent EKG monitoring. Activated charcoal was recommended which was given.  Patient was admitted for concerns of suicidal attempt. Also reported to have homicidal ideations  Discharge Diagnoses:  Principal Problem:   MDD (major depressive disorder), single episode, severe (HCC) Active Problems:   Medication overdose   Suicide attempt (HCC)   1. Intentional medication overdose 1. Intentional bupropion and trazodone overdose  2. Case was already discussed with Poison Control who recommended close monitoring for 24hrs 3. Most recent EKG reviewed with QTc of 445 4. Remains stable thus far 5. Have consulted Psych who recommends Inpatient Psych 6. Medically clear for inpatient psych at this time 7. SW following. No psych beds  2. Depression/SI/HI 1. Plan per Psychiatry 2. Involuntarily commit per Psych if patient decides to leave 3. Patient cannot leave AMA 3. Obesity 1. Stable at present 2. Recommend diet/lifestyle modification 4. Chest pain 1. Troponin neg 2. Ordered and reviewed EKG, unremarkable with  NSR, nonischemic 3. HEART score of 2, suggesting outpatient follow up 4. Chest pain seems to correlate to increased anxiety/agitation while here 5. No further chest pains  Discharge Instructions   Allergies as of 04/21/2018      Reactions   Motrin [ibuprofen] Other (See Comments)      Medication List    STOP taking these medications   buPROPion 300 MG 24 hr tablet Commonly known as:  WELLBUTRIN XL   traZODone 100 MG tablet Commonly known as:  DESYREL     TAKE these medications   folic acid 1 MG tablet Commonly known as:  FOLVITE Take 1 tablet (1 mg total) by mouth daily. Start taking on:  04/22/2018   hydrOXYzine 10 MG tablet Commonly known as:  ATARAX/VISTARIL Take 1 tablet (10 mg total) by mouth every 4 (four) hours as needed for anxiety.      Follow-up Information    Follow up with PCP as scheduled Follow up.          Allergies  Allergen Reactions  . Motrin [Ibuprofen] Other (See Comments)    Consultations:  Psychiatry  Procedures/Studies: Dg Chest Port 1 View  Result Date: 04/16/2018 CLINICAL DATA:  Suicide attempt EXAM: PORTABLE CHEST 1 VIEW COMPARISON:  None. FINDINGS: The heart size and mediastinal contours are within normal limits. Both lungs are clear. The visualized skeletal structures are unremarkable. IMPRESSION: No active disease. Electronically Signed   By: Marlan Palau M.D.   On: 04/16/2018 16:18     Subjective: Feeling depressed  Discharge Exam: Vitals:   04/20/18 2027 04/21/18 0447  BP: 118/74 109/84  Pulse: 76 70  Resp: 18 18  Temp: 98.3 F (36.8 C) 98.3 F (36.8 C)  SpO2: 96% 96%   Vitals:   04/20/18 0528 04/20/18  1417 04/20/18 2027 04/21/18 0447  BP: 111/79 109/74 118/74 109/84  Pulse: (!) 54 82 76 70  Resp: 16 20 18 18   Temp: 98.3 F (36.8 C) 98.6 F (37 C) 98.3 F (36.8 C) 98.3 F (36.8 C)  TempSrc: Oral Oral Oral Oral  SpO2: 95% 98% 96% 96%  Weight:      Height:        General: Pt is alert, awake, not in  acute distress Cardiovascular: RRR, S1/S2 +, no rubs, no gallops Respiratory: CTA bilaterally, no wheezing, no rhonchi Abdominal: Soft, NT, ND, bowel sounds + Extremities: no edema, no cyanosis   The results of significant diagnostics from this hospitalization (including imaging, microbiology, ancillary and laboratory) are listed below for reference.     Microbiology: No results found for this or any previous visit (from the past 240 hour(s)).   Labs: BNP (last 3 results) No results for input(s): BNP in the last 8760 hours. Basic Metabolic Panel: Recent Labs  Lab 04/16/18 1628 04/17/18 0321 04/18/18 0131 04/19/18 0342  NA 137 138 138 138  K 4.3 3.7 3.8 4.0  CL 106 107 108 108  CO2 23 23 24 22   GLUCOSE 90 113* 78 89  BUN 13 10 9 9   CREATININE 1.24 1.39* 1.38* 1.33*  CALCIUM 9.0 8.5* 8.7* 8.6*   Liver Function Tests: Recent Labs  Lab 04/16/18 1628 04/18/18 0131  AST 42* 27  ALT 49* 36  ALKPHOS 40 36*  BILITOT 1.1 0.7  PROT 6.3* 6.1*  ALBUMIN 3.8 3.4*   No results for input(s): LIPASE, AMYLASE in the last 168 hours. No results for input(s): AMMONIA in the last 168 hours. CBC: Recent Labs  Lab 04/16/18 1628 04/17/18 0321  WBC 6.5 6.0  NEUTROABS 4.1  --   HGB 13.1 12.3*  HCT 40.6 37.2*  MCV 85.3 84.0  PLT 206 186   Cardiac Enzymes: Recent Labs  Lab 04/18/18 1026  TROPONINI <0.03   BNP: Invalid input(s): POCBNP CBG: No results for input(s): GLUCAP in the last 168 hours. D-Dimer No results for input(s): DDIMER in the last 72 hours. Hgb A1c No results for input(s): HGBA1C in the last 72 hours. Lipid Profile No results for input(s): CHOL, HDL, LDLCALC, TRIG, CHOLHDL, LDLDIRECT in the last 72 hours. Thyroid function studies No results for input(s): TSH, T4TOTAL, T3FREE, THYROIDAB in the last 72 hours.  Invalid input(s): FREET3 Anemia work up No results for input(s): VITAMINB12, FOLATE, FERRITIN, TIBC, IRON, RETICCTPCT in the last 72  hours. Urinalysis No results found for: COLORURINE, APPEARANCEUR, LABSPEC, PHURINE, GLUCOSEU, HGBUR, BILIRUBINUR, KETONESUR, PROTEINUR, UROBILINOGEN, NITRITE, LEUKOCYTESUR Sepsis Labs Invalid input(s): PROCALCITONIN,  WBC,  LACTICIDVEN Microbiology No results found for this or any previous visit (from the past 240 hour(s)).  Time spent: 30 min  SIGNED:   Rickey Barbara, MD  [facility] 04/21/2018, 12:08 PM  If 7PM-7AM, please contact night-coverage

## 2018-04-21 NOTE — Progress Notes (Signed)
Trevor Graham is a 42 year old male pt admitted on voluntary basis from medical floor after intentional overdose. On admission, he does endorse he took overdose but denies any SI currently and reports that he is feeling better and is able to contract for safety while in the hospital. He denies HI or A/V hallucinations as well. He reports that he was going for therapy at [facility] in [place] and reports that he was started on medications while there but reports that they were not helping him and reports that he would like to get started on some new medications to help with his depression. He denies any substance abuse issues. He reports that he will go back to live with his wife when he gets discharged. He was escorted to the unit, oriented to the milieu and safety maintained.

## 2018-04-21 NOTE — Clinical Social Work Note (Signed)
CSW advised that [facility] has a bed fort patient today. Mr. Purdom reviewed and signed the Voluntary Admission and Consent for Treatment form, and it was faxed to [facility]. Mr. Prout will be transported by Derrell Lolling and will go to room 407 - Bed 2. The phone number to call report is [phone].  Genelle Bal, MSW, LCSW Licensed Clinical Social Worker Clinical Social Work Department [employer] [phone]

## 2018-04-21 NOTE — Progress Notes (Signed)
Report was called to Trevor Graham at [facility].Patient given discharge instructions. Patient's belongings were retrieved and returned. Patient left unit in stable condition.

## 2018-04-22 DIAGNOSIS — E119 Type 2 diabetes mellitus without complications: Secondary | ICD-10-CM

## 2018-04-22 DIAGNOSIS — F332 Major depressive disorder, recurrent severe without psychotic features: Principal | ICD-10-CM

## 2018-04-22 LAB — TSH: TSH: 1.243 u[IU]/mL (ref 0.350–4.500)

## 2018-04-22 LAB — COMPREHENSIVE METABOLIC PANEL
ALT: 51 U/L — AB (ref 0–44)
ANION GAP: 8 (ref 5–15)
AST: 33 U/L (ref 15–41)
Albumin: 4 g/dL (ref 3.5–5.0)
Alkaline Phosphatase: 45 U/L (ref 38–126)
BUN: 14 mg/dL (ref 6–20)
CHLORIDE: 105 mmol/L (ref 98–111)
CO2: 26 mmol/L (ref 22–32)
Calcium: 9.2 mg/dL (ref 8.9–10.3)
Creatinine, Ser: 1.35 mg/dL — ABNORMAL HIGH (ref 0.61–1.24)
GFR calc Af Amer: >60 mL/min (ref >60)
GFR calc non Af Amer: >60 mL/min (ref >60)
Glucose, Bld: 100 mg/dL — ABNORMAL HIGH (ref 70–99)
Potassium: 4 mmol/L (ref 3.5–5.1)
SODIUM: 139 mmol/L (ref 135–145)
Total Bilirubin: 1 mg/dL (ref 0.3–1.2)
Total Protein: 7.5 g/dL (ref 6.5–8.1)

## 2018-04-22 LAB — BRAIN NATRIURETIC PEPTIDE: B Natriuretic Peptide: 7.4 pg/mL (ref 0.0–100.0)

## 2018-04-22 MED ORDER — ESCITALOPRAM OXALATE 5 MG PO TABS: 5.0000 mg | ORAL_TABLET | Freq: Every day | ORAL | 0 refills | Status: AC
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
## 2018-04-22 NOTE — Progress Notes (Signed)
  [facility] :  Will you be returning to the same living situation after discharge:  Yes,  patient reports he is returning home with his wife and children At discharge, do you have transportation home?: Yes,  patient reports his wife will pick him up at discharge Do you have the ability to pay for your medications: No.  Release of information consent forms completed and in the chart;  Patient's signature needed at discharge.  Patient to Follow up at: Follow-up Information    [facility] Go on 04/30/2018.   Why:  Please attend your hospital follow up appointment on Wednesday, 04/30/18. Be sure to bring a Photo ID and any discharge paperwork from this hospitalization.   Contact information: [address] [place] [address] [phone]           Next level of care provider has access to [facility] Link:yes  Safety Planning and Suicide Prevention discussed: Yes,  with the patient's wife  Have you used any form of tobacco in the last 30 days? (Cigarettes, Smokeless Tobacco, Cigars, and/or Pipes): No  Has patient been referred to the Quitline?: N/A patient is not a smoker  Patient has been referred for addiction treatment: N/A  Maeola SarahJolan E Bethel Gaglio, LCSWA 04/22/2018, 1:12 PM

## 2018-04-22 NOTE — Progress Notes (Signed)
NUTRITION NOTE  Consult received for new onset Type 2 DM. Per review of labs, HgbA1c is 6.0%, pre-diabetes range. Glucose this AM is 100 mg/dL; two other readings from 11/8 and 11/9 are 78 and 89 mg/dL, respectively.   Patient currently on a Regular diet. Will change to Carb Modified to decrease availability of sugary beverages, dessert items, and large portions.   DM diet education not warranted at this time. Encourage patient to limit processed foods such as chips and candy, dessert items, sugary beverages, and to be mindful of portion sizes (general healthy eating pattern).   Should additional nutrition-related needs arise, please re-consult RD.   Trenton GammonJessica Nuh Lipton, MS, RD, LDN, [facility] Inpatient Clinical Dietitian Pager # [phone] After hours/weekend pager # [phone]

## 2018-04-22 NOTE — [facility] Suicide Risk Assessment (Addendum)
[facility] INPATIENT:  Family/Significant Other Suicide Prevention Education  Suicide Prevention Education:  Education Complete; Trevor Graham, wife [phone]) has been identified by the patient as the family member/significant other with whom the patient will be residing, and identified as the person(s) who will aid the patient in the event of a mental health crisis (suicidal ideations/suicide attempt).  With written consent from the patient, the family member/significant other has been provided the following suicide prevention education, prior to the and/or following the discharge of the patient.  The suicide prevention education provided includes the following:  Suicide risk factors  Suicide prevention and interventions  National Suicide Hotline telephone number  [facility] assessment telephone number  [facility] Emergency Assistance 911  [facility] and/or Residential Mobile Crisis Unit telephone number  Request made of family/significant other to:  Remove weapons (e.g., guns, rifles, knives), all items previously/currently identified as safety concern.    Remove drugs/medications (over-the-counter, prescriptions, illicit drugs), all items previously/currently identified as a safety concern.  The family member/significant other verbalizes understanding of the suicide prevention education information provided.  The family member/significant other agrees to remove the items of safety concern listed above.  Patient's wife reports that she does not have any questions or concerns regarding the patient discharging today. She reports that she believes the patient's condition has improved since being stabilized on medications and that she believes he is ready for discharge.   Trevor Graham 04/22/2018, 1:08 PM

## 2018-04-22 NOTE — Progress Notes (Signed)
Recreation Therapy Notes  Animal-Assisted Activity (AAA) Program Checklist/Progress Notes Patient Eligibility Criteria Checklist & Daily Group note for Rec Tx Intervention  Date: 11.12.19 Time: 1430 [address]   AAA/T Program Assumption of Risk Form signed by Engineer, productionatient/ or Parent Legal Guardian YES   Patient is free of allergies or sever asthma  YES   Patient reports no fear of animals  YES  Patient reports no history of cruelty to animals YES   Patient understands his/her participation is voluntary YES      Patient washes hands before animal contact  YES   Patient washes hands after animal contact YES   Education: Charity fundraiserHand Washing, Appropriate Animal Interaction   Education Outcome: Acknowledges understanding/In group clarification offered/Needs additional education.   Clinical Observations/Feedback: Pt did not attend activity.     Caroll RancherMarjette Averie Hornbaker, LRT/CTRS      Caroll RancherLindsay, Yomara Toothman A 04/22/2018 3:19 PM
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
## 2018-04-22 NOTE — [facility] Suicide Risk Assessment (Signed)
[facility]   Principal Problem: Major depression, recurrent [facility](HCC) Discharge Diagnoses:  Patient Active Problem List   Diagnosis Date Noted  . Major depression, recurrent (HCC) [F33.9] 04/21/2018  . Chest pain [R07.9] 04/21/2018  . Diabetes mellitus type 2 in obese (HCC) [E11.69, E66.9] 04/21/2018  . 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 Medication overdose [T50.901A] 04/17/2018  . Suicide attempt (HCC) [T14.91XA] 04/17/2018  . MDD (major depressive disorder), single episode, severe (HCC) [F32.2]     Total Time spent with patient: 30 minutes  Musculoskeletal: Strength & Muscle Tone: within normal limits Gait & Station: normal Patient leans: N/A  Psychiatric Specialty Exam: ROS denies headache, no chest pain, no shortness of breath, no vomiting   Blood pressure 125/80, pulse 72, temperature 98.6 F (37 C), resp. rate 18, height 6\' 2"  (1.88 m), weight 121.6 kg.Body mass index is 34.41 kg/m.  General Appearance: Well Groomed  Eye Contact::  Good  Speech:  Normal Rate409  Volume:  Normal  Mood:  improved, denies feeling depressed   Affect:  reactive, full in range   Thought Process:  Linear and Descriptions of Associations: Intact  Orientation:  Full (Time, Place, and Person)  Thought Content:  no hallucinations, no delusions, not internally preoccupied   Suicidal Thoughts:  No denies suicidal ideations, denies self injurious ideations  Homicidal Thoughts:  No  Memory:  recent and remote grossly intact   Judgement:  Other:  improving   Insight:  Fair  Psychomotor Activity:  Normal  Concentration:  Good  Recall:  Good  Fund of Knowledge:Good  Language: Good  Akathisia:  Negative  Handed:  Right  AIMS (if indicated):     Assets:  Communication Skills Desire for Improvement Resilience  Sleep:  Number of Hours: 5.75  Cognition: WNL  ADL's:  Intact   Mental Status Per Nursing Assessment::   On  Admission:  NA  Demographic Factors:  42 year old , lives with wife, has 9 children, 5 of whom are at whom , the others are with grandmother  Loss Factors: Unemployment, strained relationship with maternal extended family   Historical Factors: Recent psychiatric admission for depression, SI.  One prior psychiatric admission, reports he attempted suicide by putting cord around his neck  Denies alcohol or drug abuse   Risk Reduction Factors:   Responsible for children under 40 years of age, Sense of responsibility to family, Living with another person, especially a relative and Positive coping skills or problem solving skills  Continued Clinical Symptoms:  At this time patient is alert, attentive, well groomed, describes mood as improved, denies feeling depressed at this time, affect is reactive, no thought disorder, no suicidal or self injurious ideations. Denies homicidal or violent ideations. Presents future oriented, and focused on being able to start a job tomorrow morning . Denies hallucinations, no delusions.  Denies medication side effects and states Lexapro has been well tolerated thus far . Identifies having a job opportunity and sense of duty/responsibility towards his children as protective factor against hurting self. Reports that at the time he attempted suicide he had family stressors - maternal family members calling/criticizing him, and concerns about wife's fidelity. States both of these issues now improved, has spoken with wife and clarified/ is no longer concerned about marital issues as above , and states he has opted to avoid family members who are being critical and not answer the phone if they call .  Reports he plans to  follow up with outpatient psychiatric care, identifies safety plan by speaking with wife, close family members, focusing on his children, agrees to return to ED if any SI. With his express consent I have spoken with wife, who corroborates patient seems much  improved compared to his admission presentation, and back to baseline/his normal self . She corroborates he has a job lined up for tomorrow and that she would not want him to lose this opportunity as unemployment has been one of his major stressors . She also confirms that their relationship is improved, now  normalized .  Behavior on unit in good control, polite /calm on approach. Please note that patient has also been evaluated by Dr. Altamese Conway- see his note in chart.   Cognitive Features That Contribute To Risk:  No gross cognitive deficits noted upon discharge. Is alert , attentive, and oriented x 3    Suicide Risk:  Mild:  Suicidal ideation of limited frequency, intensity, duration, and specificity.  There are no identifiable plans, no associated intent, mild dysphoria and related symptoms, good self-control (both objective and subjective assessment), few other risk factors, and identifiable protective factors, including available and accessible social support.  Follow-up Information    [facility] on 04/30/2018.   Why:  Please attend your hospital follow up appointment on Wednesday, 04/30/18. Be sure to bring a Photo ID and any discharge paperwork from this hospitalization.   Contact information: [address] [place] [address] [phone]           Plan Of Care/Follow-up recommendations:  Activity:  as tolerated  Diet:  Regular Tests:  NA Other:  See below Patient is requesting discharge. As per rationale above there are no current ground for involuntary commitment Patient leaving unit in good spirits  Plans to return home- wife to pick him up later today Patient to follow up with PCP for medical issues as needed  Craige Cotta, MD 04/22/2018, 1:34 PM

## 2018-04-22 NOTE — Plan of Care (Signed)
Discharge Note  Patient verbalizes readiness for discharge. Follow up plan explained, AVS, Transition record and SRA given. Prescriptions and teaching provided. Belongings returned and signed for. Suicide safety plan completed and signed. Suicide safety sheet provided. Patient verbalizes understanding. Patient denies SI/HI and assures this Clinical research associatewriter he will seek assistance should that change. Patient discharged to lobby where wife was waiting.  Problem: Education: Goal: Knowledge of [place] [facility] Education information/materials will improve Outcome: Adequate for Discharge Goal: Emotional status will improve Outcome: Adequate for Discharge Goal: Mental status will improve Outcome: Adequate for Discharge Goal: Verbalization of understanding the information provided will improve Outcome: Adequate for Discharge   Problem: Activity: Goal: Interest or engagement in activities will improve Outcome: Adequate for Discharge Goal: Sleeping patterns will improve Outcome: Adequate for Discharge   Problem: Coping: Goal: Ability to verbalize frustrations and anger appropriately will improve Outcome: Adequate for Discharge Goal: Ability to demonstrate self-control will improve Outcome: Adequate for Discharge   Problem: Health Behavior/Discharge Planning: Goal: Identification of resources available to assist in meeting health care needs will improve Outcome: Adequate for Discharge Goal: Compliance with treatment plan for underlying cause of condition will improve Outcome: Adequate for Discharge   Problem: Physical Regulation: Goal: Ability to maintain clinical measurements within normal limits will improve Outcome: Adequate for Discharge   Problem: Safety: Goal: Periods of time without injury will increase Outcome: Adequate for Discharge   Problem: Education: Goal: Knowledge of [place] [facility] Education information/materials will improve Outcome: Adequate for Discharge Goal:  Emotional status will improve Outcome: Adequate for Discharge Goal: Mental status will improve Outcome: Adequate for Discharge Goal: Verbalization of understanding the information provided will improve Outcome: Adequate for Discharge   Problem: Activity: Goal: Interest or engagement in activities will improve Outcome: Adequate for Discharge Goal: Sleeping patterns will improve Outcome: Adequate for Discharge   Problem: Coping: Goal: Ability to verbalize frustrations and anger appropriately will improve Outcome: Adequate for Discharge Goal: Ability to demonstrate self-control will improve Outcome: Adequate for Discharge   Problem: Health Behavior/Discharge Planning: Goal: Identification of resources available to assist in meeting health care needs will improve Outcome: Adequate for Discharge Goal: Compliance with treatment plan for underlying cause of condition will improve Outcome: Adequate for Discharge   Problem: Physical Regulation: Goal: Ability to maintain clinical measurements within normal limits will improve Outcome: Adequate for Discharge   Problem: Safety: Goal: Periods of time without injury will increase Outcome: Adequate for Discharge   Problem: Education: Goal: Ability to make informed decisions regarding treatment will improve Outcome: Adequate for Discharge   Problem: Coping: Goal: Coping ability will improve Outcome: Adequate for Discharge   Problem: Health Behavior/Discharge Planning: Goal: Identification of resources available to assist in meeting health care needs will improve Outcome: Adequate for Discharge   Problem: Medication: Goal: Compliance with prescribed medication regimen will improve Outcome: Adequate for Discharge   Problem: Self-Concept: Goal: Ability to disclose and discuss suicidal ideas will improve Outcome: Adequate for Discharge Goal: Will verbalize positive feelings about self Outcome: Adequate for Discharge   Problem:  Education: Goal: Utilization of techniques to improve thought processes will improve Outcome: Adequate for Discharge Goal: Knowledge of the prescribed therapeutic regimen will improve Outcome: Adequate for Discharge   Problem: Activity: Goal: Interest or engagement in leisure activities will improve Outcome: Adequate for Discharge Goal: Imbalance in normal sleep/wake cycle will improve Outcome: Adequate for Discharge   Problem: Coping: Goal: Coping ability will improve Outcome: Adequate for Discharge Goal: Will verbalize feelings Outcome: Adequate  for Discharge   Problem: Health Behavior/Discharge Planning: Goal: Ability to make decisions will improve Outcome: Adequate for Discharge Goal: Compliance with therapeutic regimen will improve Outcome: Adequate for Discharge   Problem: Role Relationship: Goal: Will demonstrate positive changes in social behaviors and relationships Outcome: Adequate for Discharge   Problem: Safety: Goal: Ability to disclose and discuss suicidal ideas will improve Outcome: Adequate for Discharge Goal: Ability to identify and utilize support systems that promote safety will improve Outcome: Adequate for Discharge   Problem: Self-Concept: Goal: Will verbalize positive feelings about self Outcome: Adequate for Discharge Goal: Level of anxiety will decrease Outcome: Adequate for Discharge   Problem: Education: Goal: Knowledge of General Education information will improve Description Including pain rating scale, medication(s)/side effects and non-pharmacologic comfort measures Outcome: Adequate for Discharge   Problem: Health Behavior/Discharge Planning: Goal: Ability to manage health-related needs will improve Outcome: Adequate for Discharge   Problem: Coping: Goal: Level of anxiety will decrease Outcome: Adequate for Discharge   Problem: Safety: Goal: Ability to remain free from injury will improve Outcome: Adequate for Discharge

## 2018-04-22 NOTE — Progress Notes (Signed)
Patient ID: Trevor Graham, male   DOB: 1975/09/17, 42 y.o.   MRN: [id number]   S: Pt seen and evaluated for second opinion regarding safety to discharge. Pt shares about reasons for recent depression and suicide attempt, citing main stressor of unemployment. However, pt demonstrates significant hopefulness and future-orientation regarding starting his new job tomorrow AM. He cites his wife and 9 children as protective factors. He is tolerating lexapro well, and he is in agreement to have outpatient follow up and therapy follow up. He was able to engage in safety planning including [facility] or contact emergency services if he feels unable to maintain his own safety or the safety of others.  The patient is at low risk of imminent suicide. Patient denied thoughts, intent, or plan for harm to self or others, expressed significant future orientation, and expressed an ability to mobilize assistance for his needs. He is presently void of any contributing psychiatric symptoms, cognitive difficulties, or substance use which would elevate his risk for lethality. Chronic risk for lethality is elevated in light of poor social support, financial stressors, and previous suicide attempts. The chronic risk is presently mitigated by his ongoing desire and engagement in [facility] treatment and mobilization of support from family and friends. Chronic risk may elevate if he experiences any significant loss or worsening of symptoms, which can be managed and monitored through outpatient providers. At this time, acute risk for lethality is low and he is stable for ongoing outpatient management.    Modifiable risk factors were addressed during this hospitalization through appropriate pharmacotherapy and establishment of outpatient follow-up treatment. Some risk factors for suicide are situational (i.e. Unstable social support) or related personality pathology (i.e. Poor coping mechanisms) and thus cannot be further mitigated  by continued hospitalization in this setting.   O: Vitals:   04/21/18 1507 04/22/18 0700  BP: (!) 140/96 125/80  Pulse: 74 72  Resp: 18   Temp: 99.3 F (37.4 C) 98.6 F (37 C)    MSE: Fair appearance and grooming. Good eye contact. Normal speech rate/volume/tone/latency. Mood is euthymic. Affect is congruent. Thought process is logical and goal-oriented. Thought content is negative for SI/HI/AH/VH. Sensorium is clear and cognition is intact. Insight is fair and judgement is fair. Memory is grossly intact.   A: -MDD, recurrent, severe, without psychosis  P: - Recommend for outpatient follow up with mental health provider and therapist - Recommend to continue current treatment regimen without changes -Recommend discharge to outpatient level of care to stay with his wife after verification she has no additional safety concerns.

## 2018-06-10 ENCOUNTER — Emergency Department (HOSPITAL_COMMUNITY)
Admission: EM | Admit: 2018-06-10 | Discharge: 2018-06-11 | Payer: Medicaid Other | Attending: Emergency Medicine | Admitting: Emergency Medicine

## 2018-06-10 DIAGNOSIS — Z79899 Other long term (current) drug therapy: Secondary | ICD-10-CM | POA: Insufficient documentation

## 2018-06-10 DIAGNOSIS — R0789 Other chest pain: Secondary | ICD-10-CM | POA: Insufficient documentation

## 2018-06-10 DIAGNOSIS — I509 Heart failure, unspecified: Secondary | ICD-10-CM | POA: Insufficient documentation
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
## 2018-06-11 ENCOUNTER — Emergency Department (HOSPITAL_COMMUNITY): Payer: Medicaid Other

## 2018-06-11 LAB — I-STAT TROPONIN, ED: TROPONIN I, POC: 0.01 ng/mL (ref 0.00–0.08)

## 2018-06-11 LAB — BASIC METABOLIC PANEL
ANION GAP: 11 (ref 5–15)
BUN: 7 mg/dL (ref 6–20)
CHLORIDE: 105 mmol/L (ref 98–111)
CO2: 23 mmol/L (ref 22–32)
Calcium: 9.1 mg/dL (ref 8.9–10.3)
Creatinine, Ser: 1.37 mg/dL — ABNORMAL HIGH (ref 0.61–1.24)
Glucose, Bld: 136 mg/dL — ABNORMAL HIGH (ref 70–99)
Potassium: 3.9 mmol/L (ref 3.5–5.1)
SODIUM: 139 mmol/L (ref 135–145)

## 2018-06-11 LAB — CBC
HCT: 40.3 % (ref 39.0–52.0)
HEMOGLOBIN: 13.3 g/dL (ref 13.0–17.0)
MCH: 27.4 pg (ref 26.0–34.0)
MCHC: 33 g/dL (ref 30.0–36.0)
MCV: 82.9 fL (ref 80.0–100.0)
NRBC: 0 % (ref 0.0–0.2)
Platelets: 248 10*3/uL (ref 150–400)
RBC: 4.86 MIL/uL (ref 4.22–5.81)
RDW: 14.6 % (ref 11.5–15.5)
WBC: 7.7 10*3/uL (ref 4.0–10.5)

## 2018-06-11 NOTE — Discharge Instructions (Addendum)
Follow up with your cardiologist  

## 2018-06-11 NOTE — ED Notes (Signed)
Pt refused discharge vitals 

## 2018-06-11 NOTE — ED Provider Notes (Signed)
TIME SEEN: 1:59 AM  CHIEF COMPLAINT: chest pain   HPI: Patient is a 43 year old male with history of CHF, depression who presents to the emergency department in police custody with complaints of chest pain.  States pain started around 8 PM yesterday and described as a central heaviness.  Describes shortness of breath.  No lower extremity swelling or pain.  No nausea, vomiting, sweating, dizziness.  Only began to complain of symptoms after he was tased by the police and arrested.  ROS: See HPI Constitutional: no fever  Eyes: no drainage  ENT: no runny nose   Cardiovascular:  chest pain  Resp: SOB  GI: no vomiting GU: no dysuria Integumentary: no rash  Allergy: no hives  Musculoskeletal: no leg swelling  Neurological: no slurred speech ROS otherwise negative  PAST MEDICAL HISTORY/PAST SURGICAL HISTORY:  Past Medical History:  Diagnosis Date  . CHF (congestive heart failure) (HCC)   . Depression   . Suicide attempt (HCC) 04/16/2018    MEDICATIONS:  Prior to Admission medications   Medication Sig Start Date End Date Taking? Authorizing Provider  escitalopram (LEXAPRO) 5 MG tablet Take 1 tablet (5 mg total) by mouth daily. For depression 04/23/18   Armandina Stammer I, NP    ALLERGIES:  Allergies  Allergen Reactions  . Motrin [Ibuprofen] Other (See Comments)    SOCIAL HISTORY:  Social History   Tobacco Use  . Smoking status: Never Smoker  . Smokeless tobacco: Never Used  . Tobacco comment: DENIES  Substance Use Topics  . Alcohol use: Not Currently    FAMILY HISTORY: No family history on file.  EXAM: BP 120/80   Pulse (!) 108   Temp 97.9 F (36.6 C) (Oral)   Resp 20   SpO2 99%  CONSTITUTIONAL: Alert and oriented and responds appropriately to questions. Well-appearing; well-nourished HEAD: Normocephalic EYES: Conjunctivae clear, pupils appear equal, EOMI ENT: normal nose; moist mucous membranes NECK: Supple, no meningismus, no nuchal rigidity, no LAD  CARD: RRR;  S1 and S2 appreciated; no murmurs, no clicks, no rubs, no gallops CHEST:  Chest wall is tender to palpation.  No crepitus, ecchymosis, erythema, warmth, rash or other lesions present.   RESP: Normal chest excursion without splinting or tachypnea; breath sounds clear and equal bilaterally; no wheezes, no rhonchi, no rales, no hypoxia or respiratory distress, speaking full sentences ABD/GI: Normal bowel sounds; non-distended; soft, non-tender, no rebound, no guarding, no peritoneal signs, no hepatosplenomegaly BACK:  The back appears normal and is non-tender to palpation, there is no CVA tenderness EXT: Normal ROM in all joints; non-tender to palpation; no edema; normal capillary refill; no cyanosis, no calf tenderness or swelling    SKIN: Normal color for age and race; warm; no rash NEURO: Moves all extremities equally PSYCH: The patient's mood and manner are appropriate. Grooming and personal hygiene are appropriate.  MEDICAL DECISION MAKING: Patient here with complaints of chest pain and shortness of breath.  Does not appear volume overloaded.  He does not take diuretics.  He has no known history of CAD.  His EKG shows no ischemic abnormality.  His chest x-ray shows no signs of volume overload.  No pneumonia or pneumothorax.  I am able to reproduce his pain with palpation.  This seems atypical in nature.  I feel he is safe to be discharged.  His troponin is negative and symptoms have reportedly been ongoing for hours.   At this time, I do not feel there is any life-threatening condition present. I have reviewed  and discussed all results (EKG, imaging, lab, urine as appropriate) and exam findings with patient/family. I have reviewed nursing notes and appropriate previous records.  I feel the patient is safe to be discharged home without further emergent workup and can continue workup as an outpatient as needed. Discussed usual and customary return precautions. Patient/family verbalize understanding and  are comfortable with this plan.  Outpatient follow-up has been provided as needed. All questions have been answered.       EKG Interpretation  Date/Time:  Tuesday June 10 2018 23:53:49 EST Ventricular Rate:  114 PR Interval:  136 QRS Duration: 84 QT Interval:  340 QTC Calculation: 468 R Axis:   49 Text Interpretation:  Sinus tachycardia Otherwise normal ECG No significant change since last tracing other than rate is faster Confirmed by Saphira Lahmann, Baxter Hire [phone]) on 06/11/2018 1:37:19 AM         Kalima Saylor, Layla Maw, DO 06/11/18 0208

## 2018-09-25 ENCOUNTER — Ambulatory Visit: Payer: Self-pay

## 2018-09-25 NOTE — Telephone Encounter (Signed)
Incoming call from Patient, with complaint of chest pain and leg pain in calf.  Radiates to left should .  States that it feels real weak.  Onset was last week.  Reports that the pain is constant.   Diagnosed in 2009 with CHF.  Denies Pulmonary  Risk  Factors.  Reports difficulty breathing with overexertion. Also reports pain in right leg calf.  Onset yesterday.   Rates it mild.  Reports that it  "Feels like a knot in my leg" Warm to the touch.  Reviewed protocol with Patient.  Which recommends he go to Urgent care or ED for Evaluation.   Patient voiced understanding.  States he will go to [facility] [facility] [address] Patient does not have an PCP also advised Patient choose one.  .                                                         Reason for Disposition . [1] MILD pain (e.g., does not interfere with normal activities) AND [2] present > 7 days  Answer Assessment - Initial Assessment Questions 1. LOCATION: "Where does it hurt?"       left 2. RADIATION: "Does the pain go anywhere else?" (e.g., into neck, jaw, arms, back)     Left shouder real weak 3. ONSET: "When did the chest pain begin?" (Minutes, hours or days)      *No Answer*last week 4. PATTERN "Does the pain come and go, or has it been constant since it started?"  "Does it get worse with exertion?"      constant 5. DURATION: "How long does it last" (e.g., seconds, minutes, hours)     *No Answer* 6. SEVERITY: "How bad is the pain?"  (e.g., Scale 1-10; mild, moderate, or severe)    - MILD (1-3): doesn't interfere with normal activities     - MODERATE (4-7): interferes with normal activities or awakens from sleep    - SEVERE (8-10): excruciating pain, unable to do any normal activities       mild 7. CARDIAC RISK FACTORS: "Do you have any history of heart problems or risk factors for heart disease?" (e.g., prior heart attack, angina; high blood pressure, diabetes, being overweight, high cholesterol, smoking, or strong family history of  heart disease)     CHF in 2009 8. PULMONARY RISK FACTORS: "Do you have any history of lung disease?"  (e.g., blood clots in lung, asthma, emphysema, birth control pills)     Denies 9. CAUSE: "What do you think is causing the chest pain?"     *No Answer* 10. OTHER SYMPTOMS: "Do you have any other symptoms?" (e.g., dizziness, nausea, vomiting, sweating, fever, difficulty breathing, cough)       diffulty breathing when over exert myself 11. PREGNANCY: "Is there any chance you are pregnant?" "When was your last menstrual period?"       *No Answer*  Answer Assessment - Initial Assessment Questions 1. ONSET: "When did the pain start?"      yesterday 2. LOCATION: "Where is the pain located?"     calf 3. PAIN: "How bad is the pain?"    (Scale 1-10; or mild, moderate, severe)   -  MILD (1-3): doesn't interfere with normal activities    -  MODERATE (4-7): interferes with normal activities (e.g., work or  school) or awakens from sleep, limping    -  SEVERE (8-10): excruciating pain, unable to do any normal activities, unable to walk     mild 4. WORK OR EXERCISE: "Has there been any recent work or exercise that involved this part of the body?"     no 5. CAUSE: "What do you think is causing the leg pain?"     *No Answer* 6. OTHER SYMPTOMS: "Do you have any other symptoms?" (e.g., chest pain, back pain, breathing difficulty, swelling, rash, fever, numbness, weakness)     Feels like a knot not warm to touch 7. PREGNANCY: "Is there any chance you are pregnant?" "When was your last menstrual period?"  Protocols used: LEG PAIN-A-AH, CHEST PAIN-A-AH

## 2019-02-06 ENCOUNTER — Emergency Department (HOSPITAL_COMMUNITY): Payer: Self-pay

## 2019-02-06 ENCOUNTER — Emergency Department (HOSPITAL_COMMUNITY)
Admission: EM | Admit: 2019-02-06 | Discharge: 2019-02-06 | Disposition: A | Discharge status: Home / Self Care | Payer: Self-pay | Attending: Emergency Medicine | Admitting: Emergency Medicine

## 2019-02-06 ENCOUNTER — Encounter (HOSPITAL_COMMUNITY): Payer: Self-pay

## 2019-02-06 ENCOUNTER — Telehealth: Payer: Self-pay | Admitting: Cardiovascular Disease

## 2019-02-06 ENCOUNTER — Other Ambulatory Visit: Payer: Self-pay

## 2019-02-06 DIAGNOSIS — R2233 Localized swelling, mass and lump, upper limb, bilateral: Secondary | ICD-10-CM | POA: Insufficient documentation

## 2019-02-06 DIAGNOSIS — R079 Chest pain, unspecified: Secondary | ICD-10-CM | POA: Insufficient documentation

## 2019-02-06 DIAGNOSIS — I509 Heart failure, unspecified: Secondary | ICD-10-CM | POA: Insufficient documentation

## 2019-02-06 DIAGNOSIS — R0600 Dyspnea, unspecified: Secondary | ICD-10-CM | POA: Insufficient documentation

## 2019-02-06 DIAGNOSIS — R2243 Localized swelling, mass and lump, lower limb, bilateral: Secondary | ICD-10-CM | POA: Insufficient documentation

## 2019-02-06 DIAGNOSIS — E119 Type 2 diabetes mellitus without complications: Secondary | ICD-10-CM | POA: Insufficient documentation

## 2019-02-06 LAB — CBC
HCT: 38.6 % — ABNORMAL LOW (ref 39.0–52.0)
Hemoglobin: 13.1 g/dL (ref 13.0–17.0)
MCH: 28.9 pg (ref 26.0–34.0)
MCHC: 33.9 g/dL (ref 30.0–36.0)
MCV: 85 fL (ref 80.0–100.0)
Platelets: 217 10*3/uL (ref 150–400)
RBC: 4.54 MIL/uL (ref 4.22–5.81)
RDW: 14.9 % (ref 11.5–15.5)
WBC: 6.1 10*3/uL (ref 4.0–10.5)
nRBC: 0 % (ref 0.0–0.2)

## 2019-02-06 LAB — BASIC METABOLIC PANEL
Anion gap: 11 (ref 5–15)
BUN: 15 mg/dL (ref 6–20)
CO2: 23 mmol/L (ref 22–32)
Calcium: 9.1 mg/dL (ref 8.9–10.3)
Chloride: 106 mmol/L (ref 98–111)
Creatinine, Ser: 1.06 mg/dL (ref 0.61–1.24)
GFR calc Af Amer: >60 mL/min (ref >60)
GFR calc non Af Amer: >60 mL/min (ref >60)
Glucose, Bld: 110 mg/dL — ABNORMAL HIGH (ref 70–99)
Potassium: 4 mmol/L (ref 3.5–5.1)
Sodium: 140 mmol/L (ref 135–145)

## 2019-02-06 LAB — TROPONIN I (HIGH SENSITIVITY)
Troponin I (High Sensitivity): 4 ng/L (ref <18)
Troponin I (High Sensitivity): 4 ng/L (ref <18)

## 2019-02-06 LAB — BRAIN NATRIURETIC PEPTIDE: B Natriuretic Peptide: 26.6 pg/mL (ref 0.0–100.0)

## 2019-02-06 MED ORDER — SODIUM CHLORIDE 0.9% FLUSH: 3.0000 mL | Freq: Once | INTRAVENOUS | Status: DC

## 2019-02-06 MED ORDER — FUROSEMIDE 40 MG PO TABS: 40.0000 mg | ORAL_TABLET | Freq: Every day | ORAL | 0 refills | Status: DC

## 2019-02-06 NOTE — ED Provider Notes (Signed)
[facility]-EMERGENCY DEPT Provider Note   CSN: [id number] Arrival date & time: 02/06/19  0249     History   Chief Complaint Chief Complaint  Patient presents with  . Chest Pain    HPI Trevor Graham is a 43 y.o. male.     HPI Patient with PMH of CHF presents today with a chief complaint of chest pain. Patient states that he has been experiencing intermittent chest pain and dyspnea on exertion for the past 2 years, but reports that his chest pain increased last night. He reports the chest pain initially felt like heavy pressure last night, but has now progressed to feeling sharp in character. He states the pain is radiating to the left side of his neck and down his left arm. He reports that he feels nauseous, but has not vomited. Ptatient endorses diaphoresis last night, but none today. He endorses feeling "short-winded" when trying to walk, talk, or use stairs, but denies dyspnea at rest. He states he has not taken medication for his CHF for 2 years. Patient endorses bilateral swelling in feet, ankles, legs, and hands, stating that he cannot take his ring off of his finger due to the swelling.  The patient denies chest pain, shortness of breath, headache,blurred vision, neck pain, fever, cough, weakness, numbness, dizziness, anorexia, edema, abdominal pain, nausea, vomiting, diarrhea, rash, back pain, dysuria, hematemesis, bloody stool, near syncope, or syncope. Past Medical History:  Diagnosis Date  . CHF (congestive heart failure) (HCC)   . Depression   . Suicide attempt (HCC) 04/16/2018    Patient Active Problem List   Diagnosis Date Noted  . Major depression, recurrent (HCC) 04/21/2018  . Chest pain 04/21/2018  . Diabetes mellitus type 2 in obese (HCC) 04/21/2018  . 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 Medication overdose 04/17/2018  . Suicide attempt (HCC) 04/17/2018  . MDD (major depressive disorder), single  episode, severe (HCC)     Past Surgical History:  Procedure Laterality Date  . NO PAST SURGERIES          Home Medications    Prior to Admission medications   Medication Sig Start Date End Date Taking? Authorizing Provider  escitalopram (LEXAPRO) 5 MG tablet Take 1 tablet (5 mg total) by mouth daily. For depression Patient not taking: Reported on 02/06/2019 04/23/18   Sanjuana Kava, NP    Family History History reviewed. No pertinent family history.  Social History Social History   Tobacco Use  . Smoking status: Never Smoker  . Smokeless tobacco: Never Used  . Tobacco comment: DENIES  Substance Use Topics  . Alcohol use: Not Currently  . Drug use: Not Currently     Allergies   Motrin [ibuprofen]   Review of Systems Review of Systems  All other systems negative except as documented in the HPI. All pertinent positives and negatives as reviewed in the HPI. Physical Exam Updated Vital Signs BP 125/72   Pulse 62   Temp 98.2 F (36.8 C) (Oral)   Resp (!) 22   Ht 6\' 2"  (1.88 m)   Wt 133.8 kg   SpO2 100%   BMI 37.88 kg/m   Physical Exam Constitutional:      General: He is awake.     Appearance: He is well-developed and overweight.     Comments: Patient is lying comfortably in his bed, but short of breath when talking.   HENT:     Head: Normocephalic and atraumatic.  Eyes:  Pupils: Pupils are equal, round, and reactive to light.  Neck:     Musculoskeletal: Normal range of motion and neck supple.     Comments: JVP noted, but unsure if JVD is present.  Cardiovascular:     Rate and Rhythm: Normal rate and regular rhythm.     Heart sounds: Normal heart sounds, S1 normal and S2 normal. Heart sounds not distant. No murmur.     Comments: Bilateral, 1+ edema present in hands feet, ankles, up to mid shin. Dorsalis pedis and posterior tibial pulses unable to be palpated. Slight JVD may be present.  Pulmonary:     Effort: Pulmonary effort is normal. No  tachypnea or respiratory distress.     Breath sounds: No decreased breath sounds, wheezing or rhonchi.     Comments: Lungs CTA in anterior fields. Lateral and posterior lung fields not auscultated due to patient discomfort with sitting up.  Musculoskeletal:     Right lower leg: Edema present.     Left lower leg: Edema present.  Feet:     Comments: Bilateral, 1+ edema noted in feet and ankles. Dorsalis pedis and posterior tibialis pulses unable to be palpated. Skin:    General: Skin is warm and dry.     Findings: No erythema or rash.     Comments: Atraumatic. No rash or ulcerations noted.  Neurological:     Mental Status: He is alert and oriented to person, place, and time.     Motor: No weakness.  Psychiatric:        Mood and Affect: Mood normal.        Behavior: Behavior normal. Behavior is cooperative.      ED Treatments / Results  Labs (all labs ordered are listed, but only abnormal results are displayed) Labs Reviewed  BASIC METABOLIC PANEL - Abnormal; Notable for the following components:      Result Value   Glucose, Bld 110 (*)    All other components within normal limits  CBC - Abnormal; Notable for the following components:   HCT 38.6 (*)    All other components within normal limits  BRAIN NATRIURETIC PEPTIDE  TROPONIN I (HIGH SENSITIVITY)  TROPONIN I (HIGH SENSITIVITY)    EKG EKG Interpretation  Date/Time:  Friday February 06 2019 03:39:35 EDT Ventricular Rate:  90 PR Interval:    QRS Duration: 90 QT Interval:  368 QTC Calculation: 451 R Axis:   55 Text Interpretation:  Sinus rhythm Low voltage, precordial leads Baseline wander in lead(s) II III aVF improved rate, no other significant changes Confirmed by Marily MemosMesner, Jason [phone]([id number]) on 02/06/2019 4:02:09 AM   Radiology Dg Chest 2 View  Result Date: 02/06/2019 CLINICAL DATA:  Chest pain EXAM: CHEST - 2 VIEW COMPARISON:  06/11/2018 FINDINGS: Low volume chest with interstitial crowding. There is no edema,  consolidation, effusion, or pneumothorax. Normal heart size and mediastinal contours. IMPRESSION: Stable low volume chest.  No acute finding. Electronically Signed   By: Marnee SpringJonathon  Watts M.D.   On: 02/06/2019 04:32    Procedures Procedures (including critical care time)  Medications Ordered in ED Medications  sodium chloride flush (NS) 0.9 % injection 3 mL (has no administration in time range)     Initial Impression / Assessment and Plan / ED Course  I have reviewed the triage vital signs and the nursing notes.  Pertinent labs & imaging results that were available during my care of the patient were reviewed by me and considered in my medical decision making (see chart  for details).        Patient will be followed closely by cardiology.  I spoke with the cardiologist who will have the patient follow-up closely in their office in the next 1 to 2 days.  The patient has been stable here in the emergency department with 2 sets of negative high-sensitivity troponins.  Patient is advised of the plan and agrees to this.  All questions were answered.  I have advised the patient to return for any worsening in his condition.  Final Clinical Impressions(s) / ED Diagnoses   Final diagnoses:  None    ED Discharge Orders    None       Dalia Heading, PA-C 02/08/19 0803    Maudie Flakes, MD 02/12/19 [phone]

## 2019-02-06 NOTE — Telephone Encounter (Signed)
New Message   Lorren calling in from the ER has requested a new patient TOC visit to be scheduled. Appointment has been scheduled for 02/12/19 at 11:00 am with Dr Eleonore Chiquito.

## 2019-02-06 NOTE — ED Notes (Signed)
Visitor at bedside. Pt and visitor requesting update from MD, EDP made aware

## 2019-02-06 NOTE — Telephone Encounter (Signed)
Patient just discharged and let go today- TOC call for Monday 08/31

## 2019-02-06 NOTE — ED Triage Notes (Signed)
Pt reports SOB and chest pain with a hx of CHF. States that he has not taken his CHF meds in the last 2 years. Reports generalized edema as well. A&Ox4. Ambulatory.

## 2019-02-06 NOTE — ED Notes (Signed)
EKG given to EDP,Mesner,MD., for review. 

## 2019-02-06 NOTE — Discharge Instructions (Addendum)
I have spoken with the cardiologist and they will call you with an appointment.  Return here for any worsening in your condition.

## 2019-02-10 NOTE — Telephone Encounter (Signed)
Attempted to contact patient, did not ring, gave message that patient was not taking callers at this time.

## 2019-02-11 NOTE — Telephone Encounter (Signed)
02/06/2019 (7 hours) [facility] DE[facility]  "phone not accepting calls at this time"  Will try again later

## 2019-02-12 ENCOUNTER — Encounter: Payer: Self-pay | Admitting: Cardiovascular Disease

## 2019-02-12 ENCOUNTER — Other Ambulatory Visit: Payer: Self-pay

## 2019-02-12 ENCOUNTER — Ambulatory Visit (INDEPENDENT_AMBULATORY_CARE_PROVIDER_SITE_OTHER): Payer: Self-pay | Admitting: Cardiovascular Disease

## 2019-02-12 VITALS — BP 134/94 | HR 88 | Temp 97.9°F | Ht 74.0 in | Wt 309.0 lb

## 2019-02-12 DIAGNOSIS — I5032 Chronic diastolic (congestive) heart failure: Secondary | ICD-10-CM

## 2019-02-12 DIAGNOSIS — R0602 Shortness of breath: Secondary | ICD-10-CM

## 2019-02-12 DIAGNOSIS — I10 Essential (primary) hypertension: Secondary | ICD-10-CM

## 2019-02-12 MED ORDER — FUROSEMIDE 20 MG PO TABS: 20.0000 mg | ORAL_TABLET | Freq: Every day | ORAL | 3 refills | Status: AC

## 2019-02-12 MED ORDER — LOSARTAN POTASSIUM 25 MG PO TABS: 25.0000 mg | ORAL_TABLET | Freq: Every day | ORAL | 3 refills | Status: AC

## 2019-02-12 NOTE — Telephone Encounter (Signed)
Patient was attempted to contact to notify of appointment, will route to nurse to make aware.

## 2019-02-12 NOTE — Progress Notes (Signed)
Cardiology Office Note:   Date:  02/12/2019  NAME:  Trevor Graham    MRN: [id number] DOB:  1975-10-31   PCP:  Patient, No Pcp Per  Cardiologist:  No primary care provider on file.  Electrophysiologist:  None   Referring MD: No ref. provider found   Chief Complaint  Patient presents with  . Shortness of Breath   History of Present Illness:   Trevor Graham is a 43 y.o. male with a hx of depression who is being seen today for the evaluation of shortness of breath.  He reports a long history of congestive heart failure, diastolic heart failure.  He was [place] in [place].  I reviewed his most recent echocardiogram from March 2018 that was normal.  I also reviewed his most recent nuclear medicine stress test in March 2018 that was also normal, without evidence of ischemia.  Both studies are available in care everywhere through the epic system.  He seems to have been seen by a cardiologist there for high blood pressure and diastolic heart failure.  He reports for the past week or so has had worsening shortness of breath and lower extremity edema.  [place] and just has not established with us since he has been here for nearly 1 year.  He states that the shortness of breath was mainly with exertion, and he also tightness in his chest.  In the emergency room he ruled out for myocardial infarction, and a BNP was obtained that was extremely normal.  He was given a prescription for Lasix and instructed follow-up with us.  He states he gets a lower extremity edema after working all day.  The shortness of breath can come and go but is worsened over the past week or so.  He has had chest tightness in the past, and has been extensively evaluated [place] [facility] [place].  He is on no blood pressure medications currently.  He reports he lost up to 70 pounds in the past, but has fallen off his diet.  He expresses a desire to get back on this.  Of  note, he was seen in the emergency room on 02/06/2019 chest pain.  He ruled out for ACS with 2- troponins and had normal BNP.  His EKG at that time was very normal.  Past Medical History: Past Medical History:  Diagnosis Date  . CHF (congestive heart failure) (HCC)   . Depression   . Hypertension   . Suicide attempt (HCC) 04/16/2018    Past Surgical History: Past Surgical History:  Procedure Laterality Date  . NO PAST SURGERIES      Current Medications: Current Meds  Medication Sig  . escitalopram (LEXAPRO) 5 MG tablet Take 1 tablet (5 mg total) by mouth daily. For depression  . furosemide (LASIX) 40 MG tablet Take 1 tablet (40 mg total) by mouth daily.     Allergies:    Motrin [ibuprofen]   Social History: Social History   Socioeconomic History  . Marital status: Married    Spouse name: Not on file  . Number of children: Not on file  . Years of education: Not on file  . Highest education level: Not on file  Occupational History  . Not on file  Social Needs  . Financial resource strain: Not on file  . Food insecurity    Worry: Not on file    Inability: Not on file  . Transportation needs    Medical: Not on  file    Non-medical: Not on file  Tobacco Use  . Smoking status: Never Smoker  . Smokeless tobacco: Never Used  . Tobacco comment: DENIES  Substance and Sexual Activity  . Alcohol use: Not Currently  . Drug use: Not Currently  . Sexual activity: Yes  Lifestyle  . Physical activity    Days per week: Not on file    Minutes per session: Not on file  . Stress: Not on file  Relationships  . Social Musician on phone: Not on file    Gets together: Not on file    Attends religious service: Not on file    Active member of club or organization: Not on file    Attends meetings of clubs or organizations: Not on file    Relationship status: Not on file  Other Topics Concern  . Not on file  Social History Narrative  . Not on file     Family  History: The patient's family history includes Cancer in his mother; Heart disease in his paternal grandmother.  ROS:   All other ROS reviewed and negative. Pertinent positives noted in the HPI.     EKGs/Labs/Other Studies Reviewed:   The following studies were personally reviewed by me today:  EKG:  EKG is not ordered today.  EKG from emergency room dated 02/09/2019 was reviewed today in office.  That EKG demonstrates normal sinus rhythm heart rate 90, normal intervals, no acute ST-T changes suggestive [place], no prior infarction.  Recent Labs: 04/22/2018: ALT 51; TSH 1.243 02/06/2019: B Natriuretic Peptide 26.6; BUN 15; Creatinine, Ser 1.06; Hemoglobin 13.1; Platelets 217; Potassium 4.0; Sodium 140   Recent Lipid Panel    Component Value Date/Time   CHOL 154 04/18/2018 1026   TRIG 107 04/18/2018 1026   HDL 36 (L) 04/18/2018 1026   CHOLHDL 4.3 04/18/2018 1026   VLDL 21 04/18/2018 1026   LDLCALC 97 04/18/2018 1026    Physical Exam:   VS:  BP (!) 134/94   Pulse 88   Temp 97.9 F (36.6 C)   Ht 6\' 2"  (1.88 m)   Wt (!) 309 lb (140.2 kg)   SpO2 97%   BMI 39.67 kg/m    Wt Readings from Last 3 Encounters:  02/12/19 (!) 309 lb (140.2 kg)  02/06/19 295 lb (133.8 kg)  04/16/18 273 lb (123.8 kg)    General: Well nourished, obese male Heart: Atraumatic, normal size  Eyes: PEERLA, EOMI  Neck: Supple, no JVD Endocrine: No thryomegaly Cardiac: Normal S1, S2; RRR; no murmurs, rubs, or gallops Lungs: Clear to auscultation bilaterally, no wheezing, rhonchi or rales  Abd: Soft, nontender, no hepatomegaly  Ext: Trace edema, pulses 2+ Musculoskeletal: No deformities, BUE and BLE strength normal and equal Skin: Warm and dry, no rashes   Neuro: Alert and oriented to person, place, time, and situation, CNII-XII grossly intact, no focal deficits  Psych: Normal mood and affect   ASSESSMENT:   NAME@ is a 43 y.o. male who presents for the following: 1. SOB (shortness of breath) on exertion    2. Chronic heart failure with preserved ejection fraction (HCC)   3. Essential hypertension     PLAN:   1. SOB (shortness of breath) on exertion 2. Chronic heart failure with preserved ejection fraction (HCC) 3. Essential hypertension -He presents with rather extensive history of congestive heart failure, diastolic dysfunction.  I reviewed all of the records from [facility] and care everywhere.  His most recent  echocardiogram in March 2018 was normal.  He had a nuclear medicine stress test in March 2018 which revealed no evidence of ischemia.  He seems to follow with cardiology in [place] for quite some time.  They mainly manage his blood pressure with intermittent Lasix.  He appears to have chronic chest pain that is attributed to noncardiac etiology. -His most recent visit to the emergency room showed a clear chest x-ray and BNP of 26.  He has no gross evidence of volume overload today on exam.  Blood pressure slightly up.  I spent nearly 30 minutes with him discussing the importance of blood pressure control and diet and weight loss to control his blood pressure.  The mainstay of treating diastolic heart failure is treating her blood pressure and reducing risk factors such as obesity. -Today I will start 25 mg of losartan to better control his blood pressure and reduce his Lasix to 20 mg daily as needed. -I will obtain a BM P, A1c, lipid profile in 2 weeks to make sure his kidneys are okay after starting losartan -I will obtain a repeat echocardiogram to determine his actual filling pressures.  The echocardiogram from March 2019 in [place] did not fully evaluate his diastology and we need to do that.   Disposition: Return in about 6 months (around 08/12/2019).  Medication Adjustments/Labs and Tests Ordered: Current medicines are reviewed at length with the patient today.  Concerns regarding medicines are outlined above.  Orders Placed This Encounter   Procedures  . Basic metabolic panel  . HgB A1c  . Lipid panel  . ECHOCARDIOGRAM COMPLETE   Meds ordered this encounter  Medications  . losartan (COZAAR) 25 MG tablet    Sig: Take 1 tablet (25 mg total) by mouth daily.    Dispense:  90 tablet    Refill:  3  . furosemide (LASIX) 20 MG tablet    Sig: Take 1 tablet (20 mg total) by mouth daily.    Dispense:  90 tablet    Refill:  3    Patient Instructions  Medication Instructions:  Start Losartan 25 mg daily Start Lasix 20 mg daily  If you need a refill on your cardiac medications before your next appointment, please call your pharmacy.   Lab work: Art gallery manager in 2 weeks (Friday 9/18 Lab order enclosed)   Testing/Procedures: Schedule Echo  Follow-Up: At [facility], you and your health needs are our priority.  As part of our continuing mission to provide you with exceptional heart care, we have created designated Provider Care Teams.  These Care Teams include your primary Cardiologist (physician) and Advanced Practice Providers (APPs -  Physician Assistants and Nurse Practitioners) who all work together to provide you with the care you need, when you need it. . Schedule follow up appointment in 6 months Call in Dec to schedule March appointment       Signed, [place] T. Audie Box, [place]  [address], [place], [place] [address] [phone]  02/12/2019 11:31 AM

## 2019-02-12 NOTE — Patient Instructions (Addendum)
Medication Instructions:  Start Losartan 25 mg daily  Start Lasix 20 mg daily   If you need a refill on your cardiac medications before your next appointment, please call your pharmacy.   Lab work: Art gallery manager in 2 weeks (Friday 9/18 Lab order enclosed)   Testing/Procedures: Schedule Echo  Follow-Up: At [facility], you and your health needs are our priority.  As part of our continuing mission to provide you with exceptional heart care, we have created designated Provider Care Teams.  These Care Teams include your primary Cardiologist (physician) and Advanced Practice Providers (APPs -  Physician Assistants and Nurse Practitioners) who all work together to provide you with the care you need, when you need it. . Schedule follow up appointment in 6 months Call in Dec to schedule March appointment

## 2019-02-13 NOTE — Telephone Encounter (Signed)
PATIENT HAD APPT 02/12/19

## 2019-02-19 ENCOUNTER — Other Ambulatory Visit: Payer: Self-pay

## 2019-02-19 ENCOUNTER — Ambulatory Visit (HOSPITAL_COMMUNITY): Payer: BC Managed Care – PPO | Attending: Internal Medicine

## 2019-02-19 DIAGNOSIS — I5032 Chronic diastolic (congestive) heart failure: Secondary | ICD-10-CM

## 2019-02-19 DIAGNOSIS — I10 Essential (primary) hypertension: Secondary | ICD-10-CM | POA: Diagnosis present

## 2019-02-19 DIAGNOSIS — R0602 Shortness of breath: Secondary | ICD-10-CM

## 2019-08-27 ENCOUNTER — Encounter: Payer: Self-pay | Admitting: General Practice

## 2019-09-28 IMAGING — DX DG CHEST 1V PORT
1 series · 1 of 1 positions shown · non-contrast
Comparison: None.

CLINICAL DATA: Suicide attempt

EXAM:
PORTABLE CHEST 1 VIEW

[chest ap]
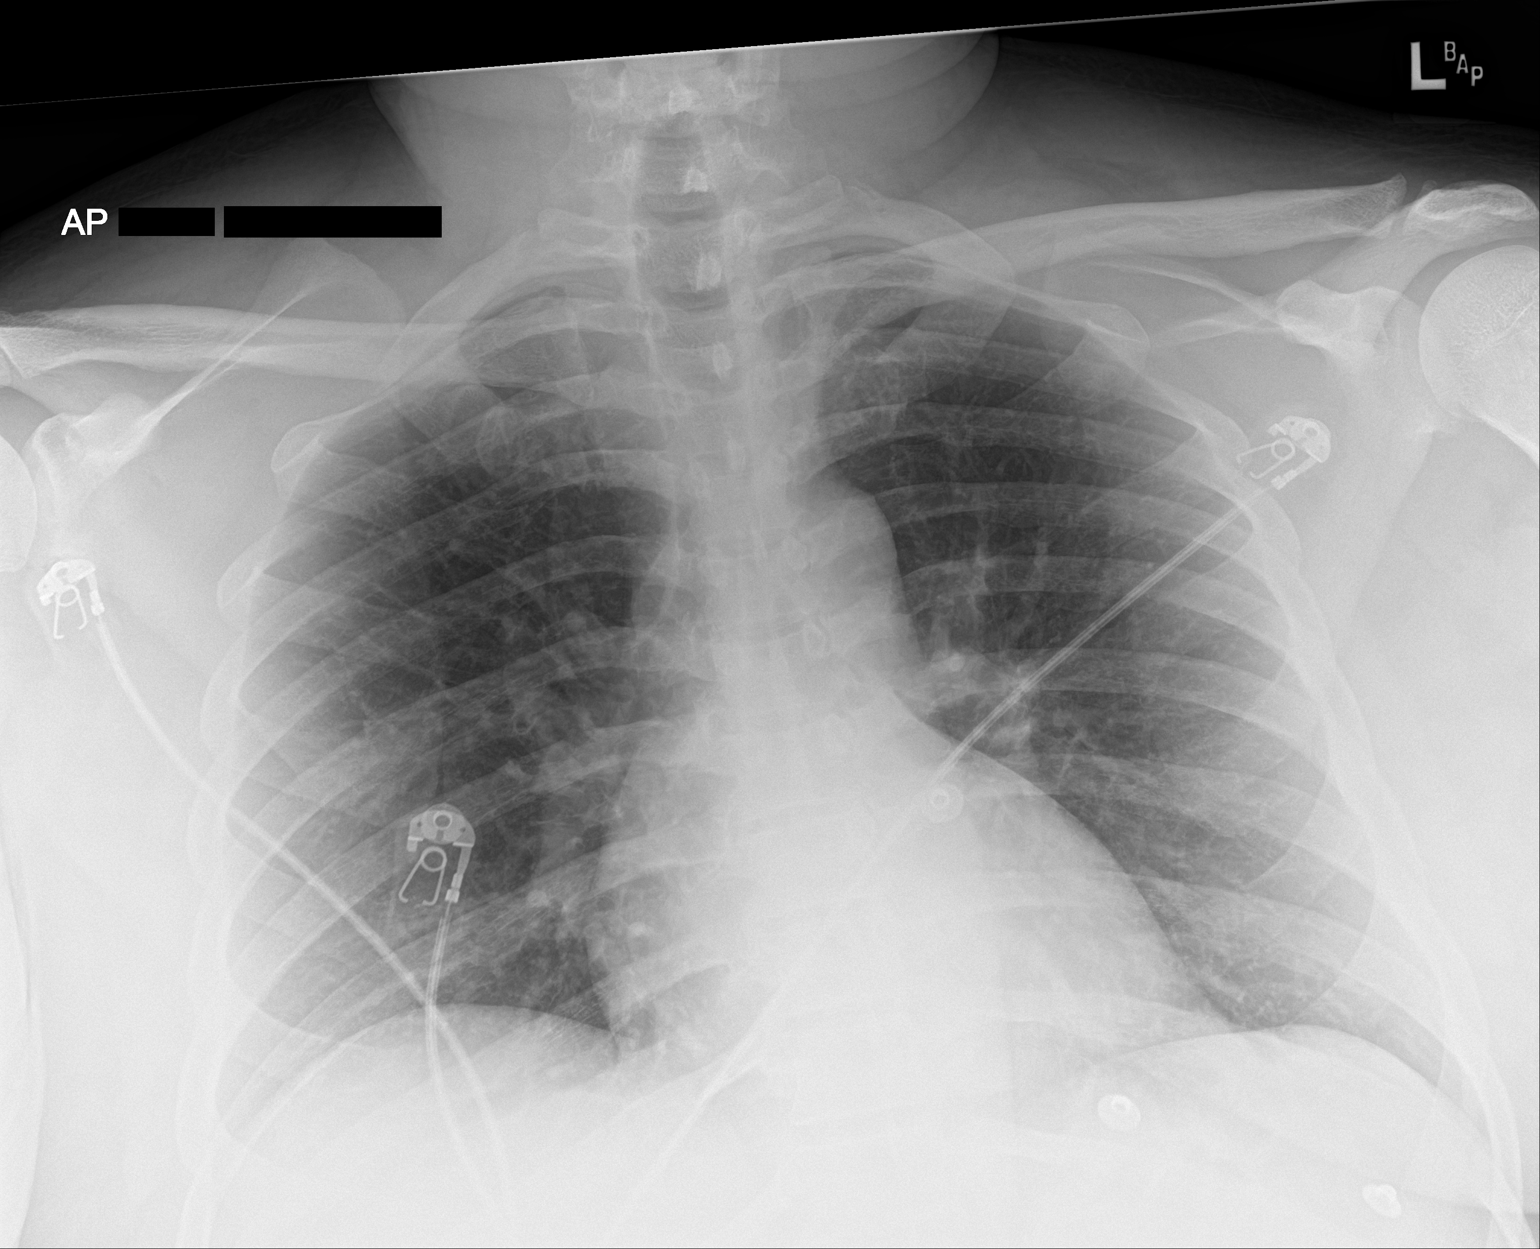

[1 of 1 positions shown; findings below may reference images not displayed]

FINDINGS: The heart size and mediastinal contours are within normal limits.
Both lungs are clear. The visualized skeletal structures are
unremarkable.
IMPRESSION: No active disease.

## 2019-11-23 IMAGING — DX DG CHEST 2V
2 series · 2 of 2 positions shown · non-contrast
Comparison: Chest radiograph performed 04/16/2018

CLINICAL DATA: Acute onset of generalized chest pain, shortness of
breath, nausea, vomiting and diarrhea. Was recently tased.

EXAM:
CHEST - 2 VIEW

[chest pa]
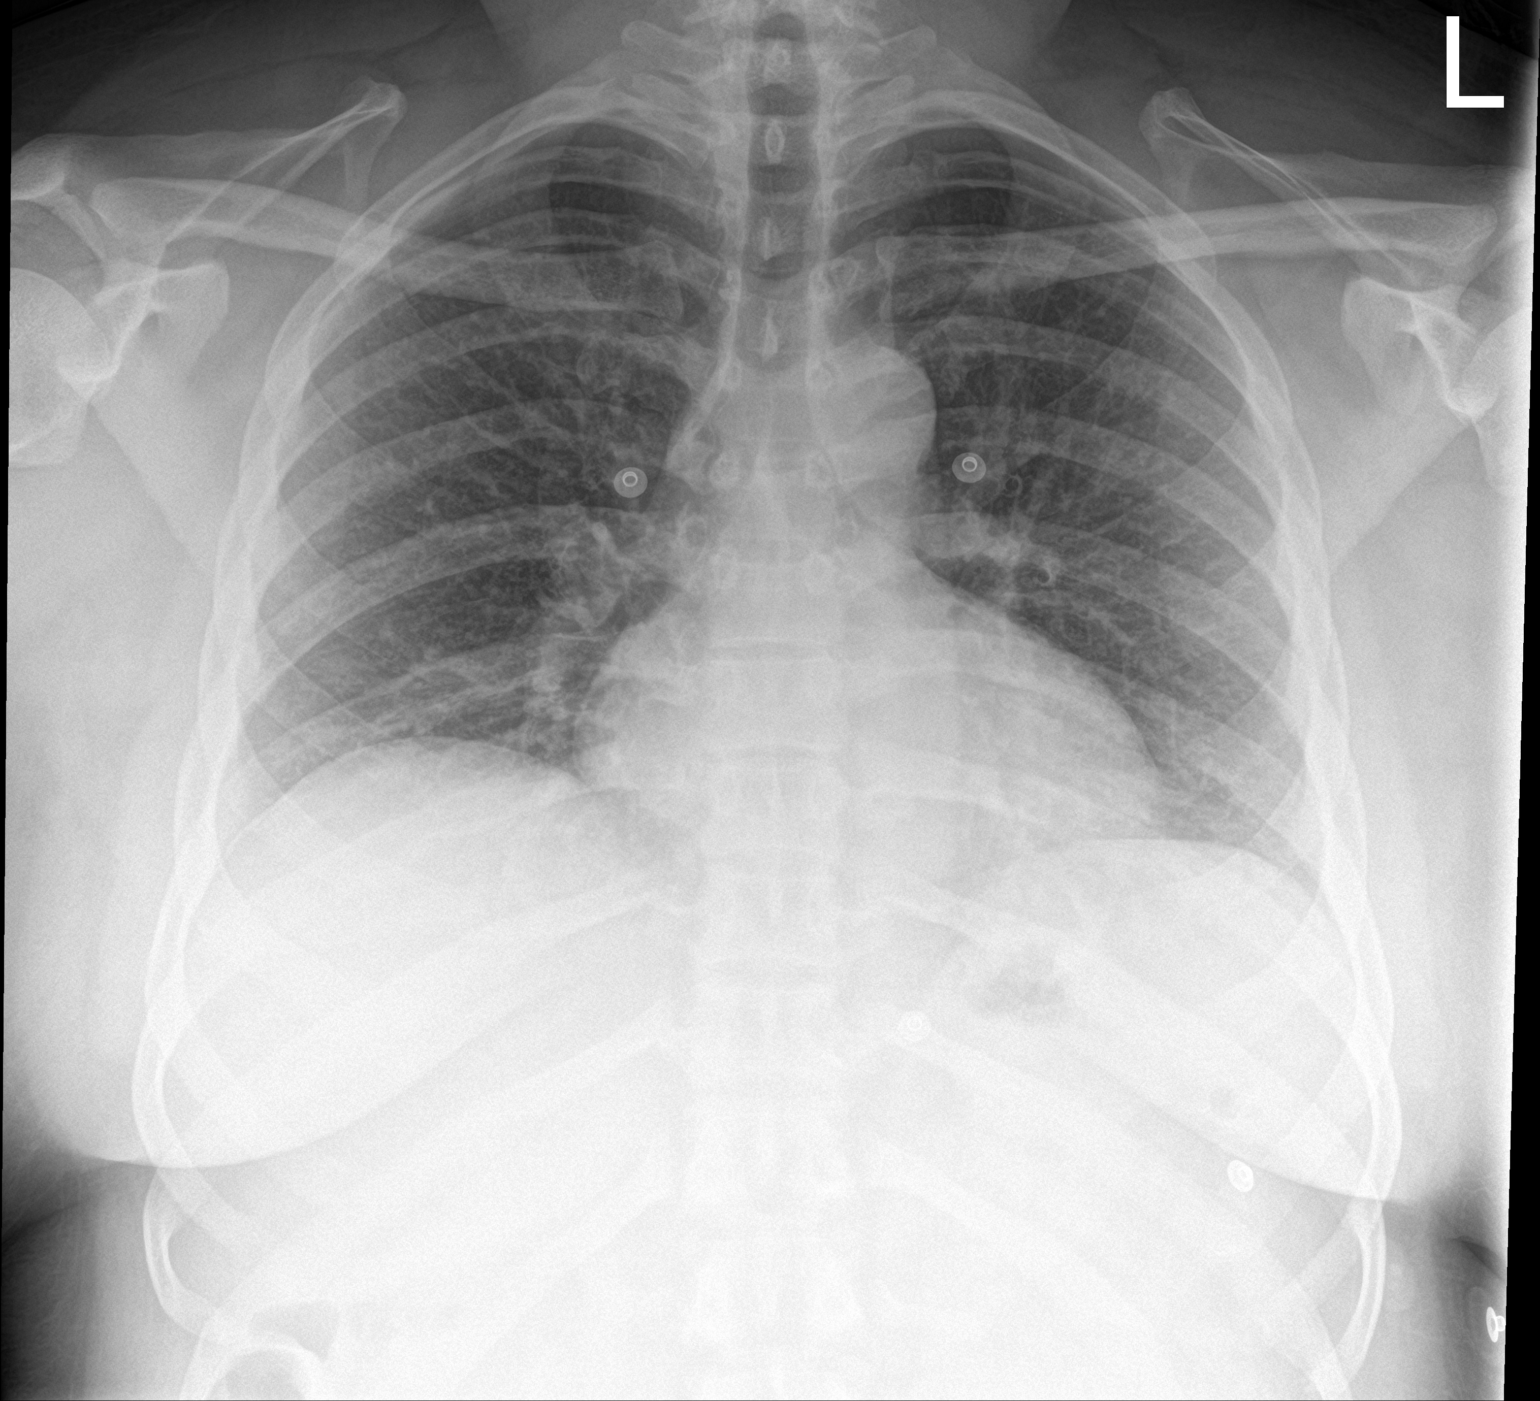

[chest lat]
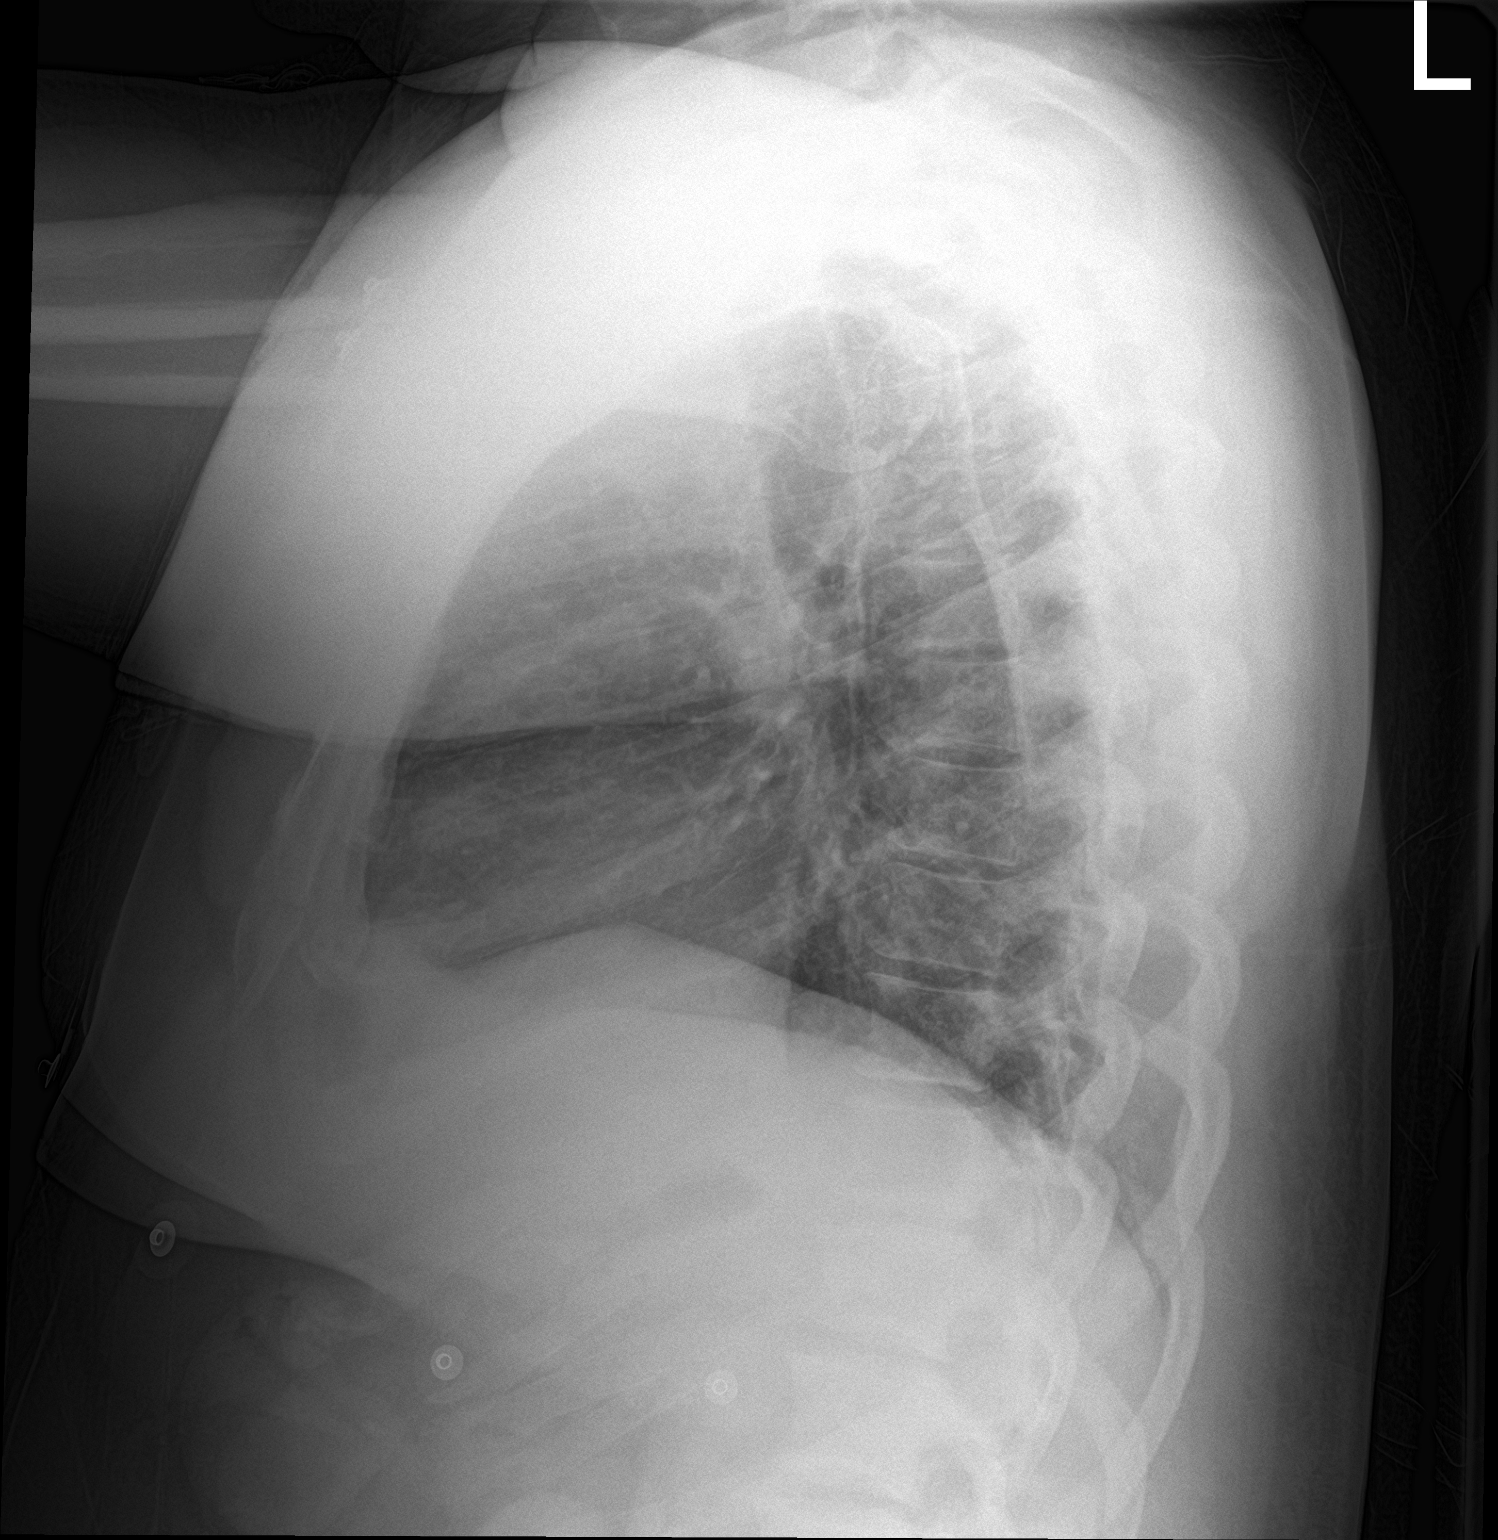

[2 of 2 positions shown; findings below may reference images not displayed]

FINDINGS: The lungs are well-aerated. Minimal bibasilar atelectasis is noted.
There is no evidence of pleural effusion or pneumothorax.

The heart is normal in size; the mediastinal contour is within
normal limits. No acute osseous abnormalities are seen.
IMPRESSION: Minimal bibasilar atelectasis noted; lungs otherwise clear.

## 2020-07-20 IMAGING — CR CHEST - 2 VIEW
2 series · 2 of 2 positions shown · non-contrast
Comparison: 06/11/2018

CLINICAL DATA: Chest pain

EXAM:
CHEST - 2 VIEW

[w chest lat]
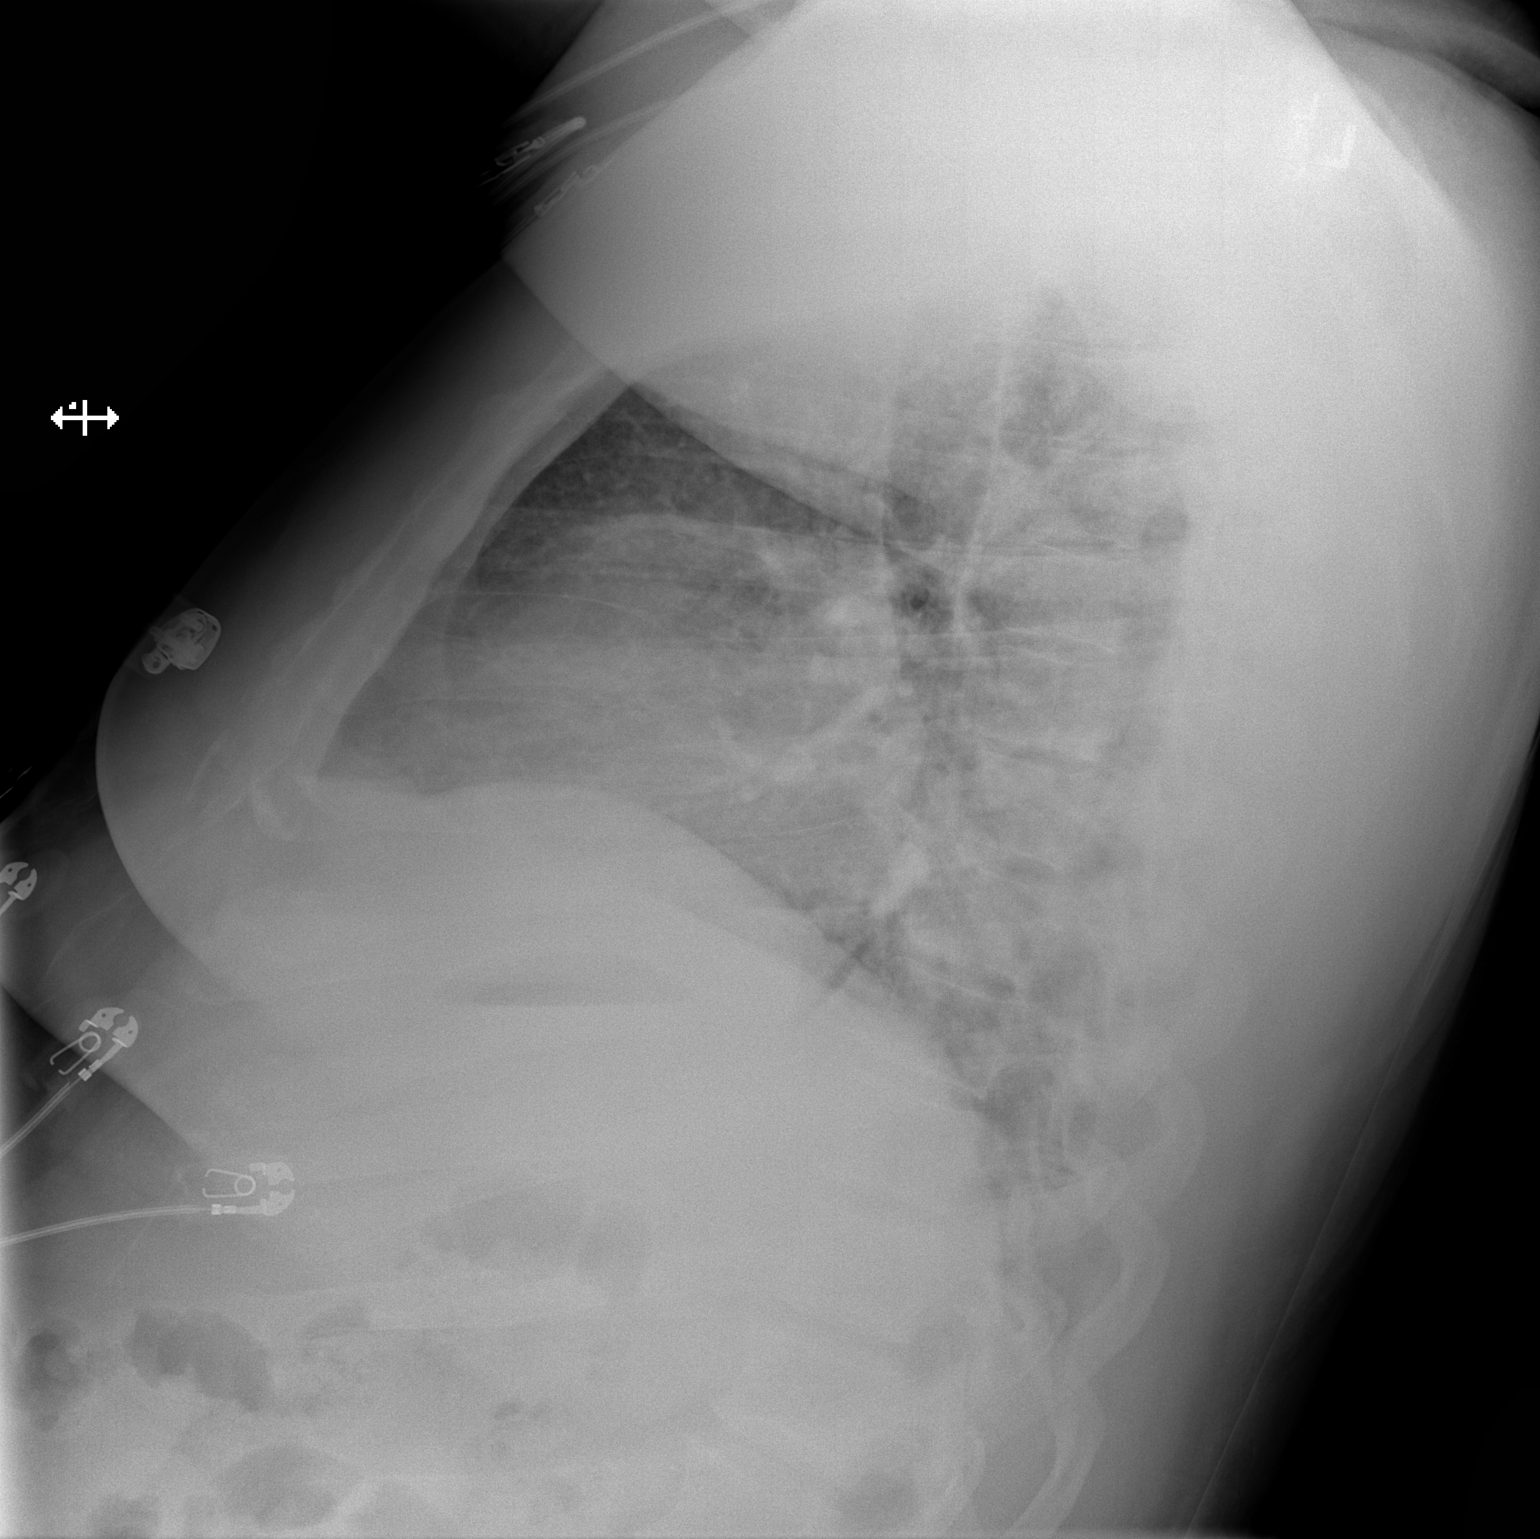

[x chest ap]
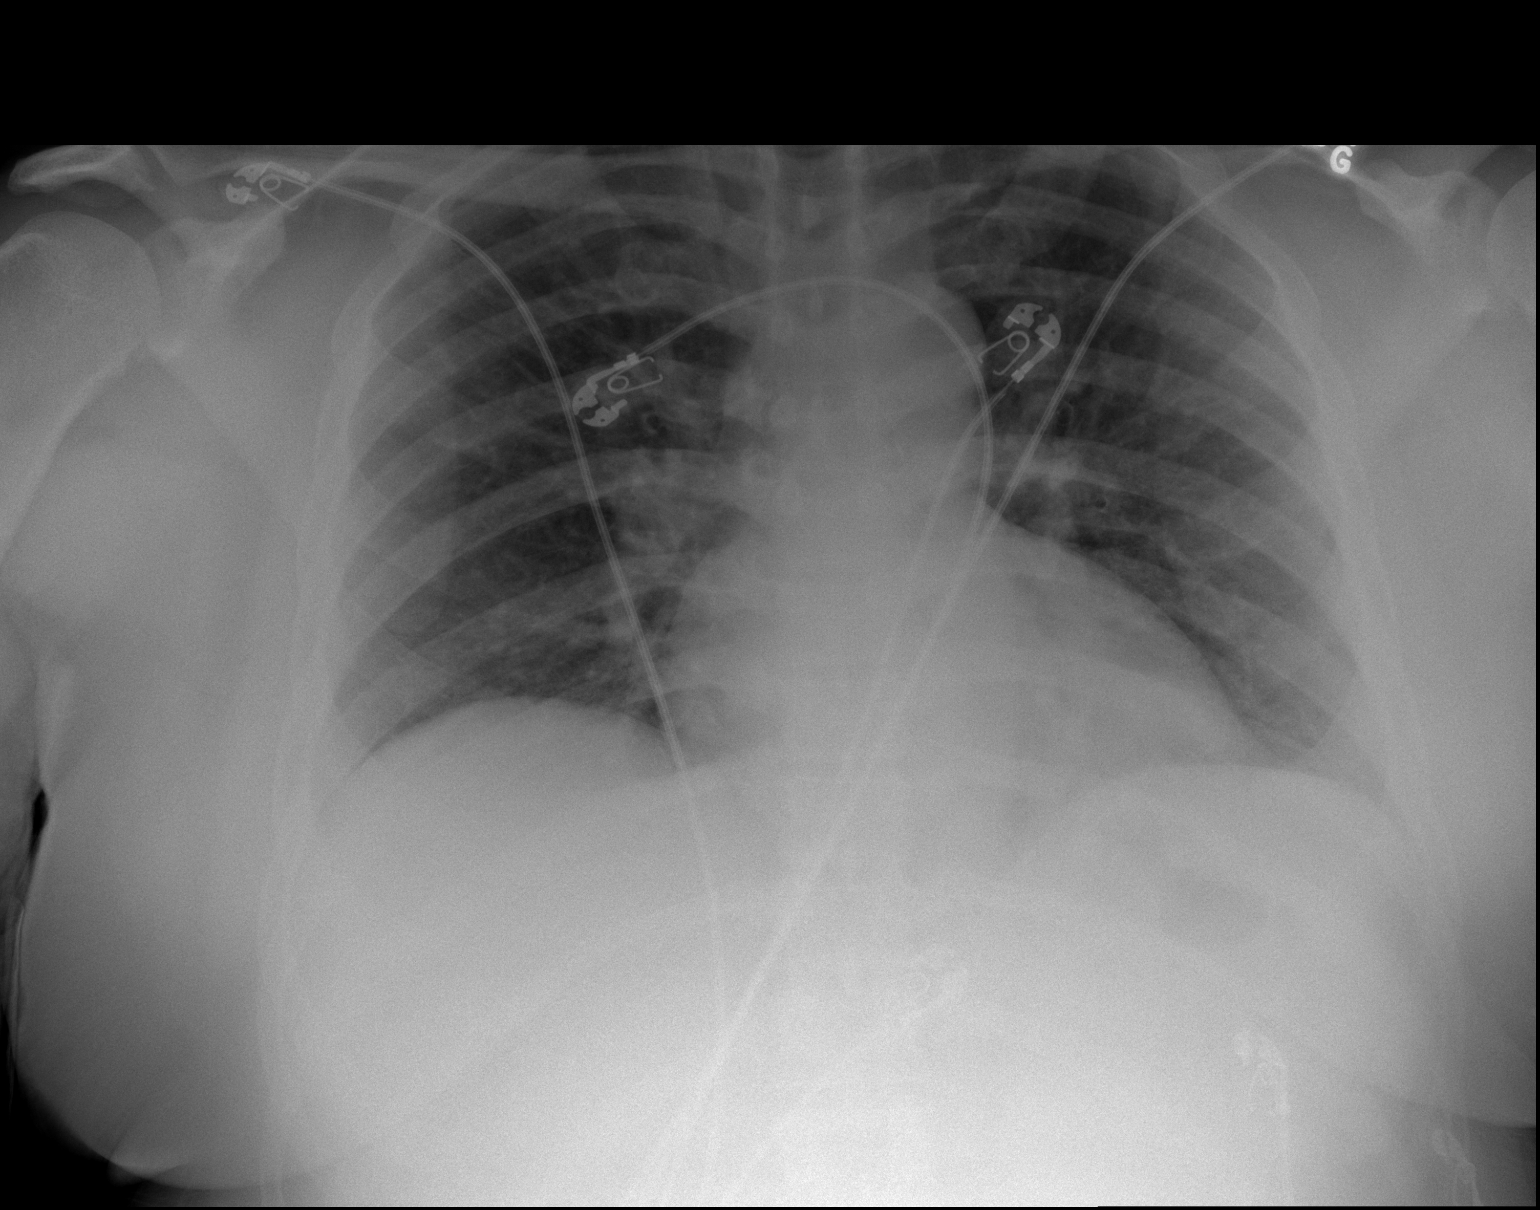

[2 of 2 positions shown; findings below may reference images not displayed]

FINDINGS: Low volume chest with interstitial crowding. There is no edema,
consolidation, effusion, or pneumothorax. Normal heart size and
mediastinal contours.
IMPRESSION: Stable low volume chest.  No acute finding.
